# Patient Record
Sex: Female | Born: 1970 | Race: White | Hispanic: No | Smoking: Never smoker
Health system: Southern US, Community
[De-identification: ages and names within clinical notes are randomized; demographics above are authoritative.]

## PROBLEM LIST (undated history)

## (undated) DIAGNOSIS — M797 Fibromyalgia: Secondary | ICD-10-CM

## (undated) DIAGNOSIS — Z9889 Other specified postprocedural states: Secondary | ICD-10-CM

## (undated) DIAGNOSIS — F419 Anxiety disorder, unspecified: Secondary | ICD-10-CM

## (undated) DIAGNOSIS — D649 Anemia, unspecified: Secondary | ICD-10-CM

## (undated) DIAGNOSIS — F32A Depression, unspecified: Secondary | ICD-10-CM

## (undated) DIAGNOSIS — R112 Nausea with vomiting, unspecified: Secondary | ICD-10-CM

## (undated) DIAGNOSIS — F329 Major depressive disorder, single episode, unspecified: Secondary | ICD-10-CM

## (undated) DIAGNOSIS — T7840XA Allergy, unspecified, initial encounter: Secondary | ICD-10-CM

## (undated) DIAGNOSIS — M1990 Unspecified osteoarthritis, unspecified site: Secondary | ICD-10-CM

## (undated) DIAGNOSIS — Z87442 Personal history of urinary calculi: Secondary | ICD-10-CM

## (undated) DIAGNOSIS — K439 Ventral hernia without obstruction or gangrene: Secondary | ICD-10-CM

## (undated) DIAGNOSIS — K219 Gastro-esophageal reflux disease without esophagitis: Secondary | ICD-10-CM

## (undated) DIAGNOSIS — I10 Essential (primary) hypertension: Secondary | ICD-10-CM

## (undated) DIAGNOSIS — I8390 Asymptomatic varicose veins of unspecified lower extremity: Secondary | ICD-10-CM

## (undated) DIAGNOSIS — E039 Hypothyroidism, unspecified: Secondary | ICD-10-CM

## (undated) HISTORY — DX: Allergy, unspecified, initial encounter: T78.40XA

## (undated) HISTORY — PX: CHOLECYSTECTOMY: SHX55

## (undated) HISTORY — DX: Personal history of urinary calculi: Z87.442

## (undated) HISTORY — DX: Asymptomatic varicose veins of unspecified lower extremity: I83.90

## (undated) HISTORY — DX: Hypothyroidism, unspecified: E03.9

## (undated) HISTORY — PX: FOOT SURGERY: SHX648

## (undated) HISTORY — DX: Anxiety disorder, unspecified: F41.9

## (undated) HISTORY — PX: OTHER SURGICAL HISTORY: SHX169

## (undated) HISTORY — PX: CARPAL TUNNEL RELEASE: SHX101

## (undated) HISTORY — PX: LITHOTRIPSY: SUR834

---
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
## 1997-09-05 ENCOUNTER — Ambulatory Visit (HOSPITAL_BASED_OUTPATIENT_CLINIC_OR_DEPARTMENT_OTHER): Admission: RE | Admit: 1997-09-05 | Discharge: 1997-09-05 | Payer: Self-pay | Admitting: *Deleted

## 1997-10-08 ENCOUNTER — Encounter: Admission: RE | Admit: 1997-10-08 | Discharge: 1998-01-06 | Payer: Self-pay | Admitting: Internal Medicine

## 1998-01-06 ENCOUNTER — Encounter: Admission: RE | Admit: 1998-01-06 | Discharge: 1998-02-14 | Payer: Self-pay | Admitting: Internal Medicine

## 1998-04-07 ENCOUNTER — Encounter: Admission: RE | Admit: 1998-04-07 | Discharge: 1998-04-10 | Payer: Self-pay | Admitting: Orthopedic Surgery

## 1998-06-06 ENCOUNTER — Inpatient Hospital Stay (HOSPITAL_COMMUNITY): Admission: AD | Admit: 1998-06-06 | Discharge: 1998-06-06 | Payer: Self-pay | Admitting: Obstetrics and Gynecology

## 1998-10-23 ENCOUNTER — Inpatient Hospital Stay (HOSPITAL_COMMUNITY): Admission: AD | Admit: 1998-10-23 | Discharge: 1998-10-23 | Payer: Self-pay | Admitting: Obstetrics and Gynecology

## 1998-10-31 ENCOUNTER — Encounter: Admission: RE | Admit: 1998-10-31 | Discharge: 1999-01-29 | Payer: Self-pay | Admitting: Obstetrics and Gynecology

## 1998-11-09 ENCOUNTER — Inpatient Hospital Stay (HOSPITAL_COMMUNITY): Admission: AD | Admit: 1998-11-09 | Discharge: 1998-11-09 | Payer: Self-pay | Admitting: Obstetrics and Gynecology

## 1998-11-29 ENCOUNTER — Inpatient Hospital Stay (HOSPITAL_COMMUNITY): Admission: AD | Admit: 1998-11-29 | Discharge: 1998-11-29 | Payer: Self-pay | Admitting: Obstetrics & Gynecology

## 1998-12-10 ENCOUNTER — Inpatient Hospital Stay (HOSPITAL_COMMUNITY): Admission: AD | Admit: 1998-12-10 | Discharge: 1998-12-10 | Payer: Self-pay | Admitting: *Deleted

## 1998-12-11 ENCOUNTER — Observation Stay (HOSPITAL_COMMUNITY): Admission: AD | Admit: 1998-12-11 | Discharge: 1998-12-12 | Payer: Self-pay | Admitting: Obstetrics and Gynecology

## 1998-12-12 ENCOUNTER — Encounter: Payer: Self-pay | Admitting: Obstetrics and Gynecology

## 1998-12-31 ENCOUNTER — Inpatient Hospital Stay (HOSPITAL_COMMUNITY): Admission: AD | Admit: 1998-12-31 | Discharge: 1999-01-04 | Payer: Self-pay | Admitting: Obstetrics and Gynecology

## 1999-02-17 ENCOUNTER — Emergency Department (HOSPITAL_COMMUNITY): Admission: EM | Admit: 1999-02-17 | Discharge: 1999-02-17 | Payer: Self-pay | Admitting: Emergency Medicine

## 1999-03-03 ENCOUNTER — Ambulatory Visit (HOSPITAL_COMMUNITY): Admission: RE | Admit: 1999-03-03 | Discharge: 1999-03-04 | Payer: Self-pay | Admitting: Surgery

## 1999-03-03 ENCOUNTER — Encounter: Payer: Self-pay | Admitting: Surgery

## 1999-03-03 ENCOUNTER — Encounter (INDEPENDENT_AMBULATORY_CARE_PROVIDER_SITE_OTHER): Payer: Self-pay | Admitting: *Deleted

## 2001-07-14 ENCOUNTER — Emergency Department (HOSPITAL_COMMUNITY): Admission: EM | Admit: 2001-07-14 | Discharge: 2001-07-14 | Payer: Self-pay | Admitting: Emergency Medicine

## 2001-07-14 ENCOUNTER — Encounter: Payer: Self-pay | Admitting: Emergency Medicine

## 2002-09-09 ENCOUNTER — Emergency Department (HOSPITAL_COMMUNITY): Admission: EM | Admit: 2002-09-09 | Discharge: 2002-09-09 | Payer: Self-pay | Admitting: Emergency Medicine

## 2004-03-03 ENCOUNTER — Ambulatory Visit: Payer: Self-pay | Admitting: Internal Medicine

## 2004-03-30 ENCOUNTER — Ambulatory Visit: Payer: Self-pay | Admitting: Internal Medicine

## 2004-04-27 ENCOUNTER — Ambulatory Visit: Payer: Self-pay | Admitting: Internal Medicine

## 2004-08-11 ENCOUNTER — Ambulatory Visit: Payer: Self-pay | Admitting: Internal Medicine

## 2004-11-17 ENCOUNTER — Ambulatory Visit: Payer: Self-pay | Admitting: Internal Medicine

## 2004-11-25 ENCOUNTER — Ambulatory Visit: Payer: Self-pay | Admitting: Internal Medicine

## 2005-02-10 ENCOUNTER — Ambulatory Visit (HOSPITAL_BASED_OUTPATIENT_CLINIC_OR_DEPARTMENT_OTHER): Admission: RE | Admit: 2005-02-10 | Discharge: 2005-02-10 | Payer: Self-pay | Admitting: Orthopedic Surgery

## 2005-05-07 ENCOUNTER — Other Ambulatory Visit: Admission: RE | Admit: 2005-05-07 | Discharge: 2005-05-07 | Payer: Self-pay | Admitting: Obstetrics and Gynecology

## 2005-06-01 ENCOUNTER — Ambulatory Visit: Payer: Self-pay | Admitting: Internal Medicine

## 2005-07-09 ENCOUNTER — Ambulatory Visit: Payer: Self-pay | Admitting: Internal Medicine

## 2005-10-05 ENCOUNTER — Emergency Department (HOSPITAL_COMMUNITY): Admission: EM | Admit: 2005-10-05 | Discharge: 2005-10-05 | Payer: Self-pay | Admitting: Emergency Medicine

## 2005-12-03 ENCOUNTER — Ambulatory Visit: Payer: Self-pay | Admitting: Internal Medicine

## 2006-01-14 ENCOUNTER — Ambulatory Visit: Payer: Self-pay | Admitting: Internal Medicine

## 2006-03-03 ENCOUNTER — Emergency Department (HOSPITAL_COMMUNITY): Admission: EM | Admit: 2006-03-03 | Discharge: 2006-03-03 | Payer: Self-pay | Admitting: Emergency Medicine

## 2006-03-08 ENCOUNTER — Ambulatory Visit: Payer: Self-pay | Admitting: Internal Medicine

## 2006-06-13 ENCOUNTER — Ambulatory Visit: Payer: Self-pay | Admitting: Internal Medicine

## 2006-07-11 ENCOUNTER — Emergency Department (HOSPITAL_COMMUNITY): Admission: EM | Admit: 2006-07-11 | Discharge: 2006-07-12 | Payer: Self-pay | Admitting: Emergency Medicine

## 2006-07-30 ENCOUNTER — Emergency Department (HOSPITAL_COMMUNITY): Admission: EM | Admit: 2006-07-30 | Discharge: 2006-07-31 | Payer: Self-pay | Admitting: Emergency Medicine

## 2006-08-01 ENCOUNTER — Ambulatory Visit: Payer: Self-pay | Admitting: Internal Medicine

## 2006-08-05 DIAGNOSIS — J309 Allergic rhinitis, unspecified: Secondary | ICD-10-CM | POA: Insufficient documentation

## 2006-08-05 DIAGNOSIS — F411 Generalized anxiety disorder: Secondary | ICD-10-CM | POA: Insufficient documentation

## 2006-08-05 DIAGNOSIS — E039 Hypothyroidism, unspecified: Secondary | ICD-10-CM | POA: Insufficient documentation

## 2006-08-11 ENCOUNTER — Ambulatory Visit (HOSPITAL_COMMUNITY): Admission: RE | Admit: 2006-08-11 | Discharge: 2006-08-11 | Payer: Self-pay | Admitting: Urology

## 2006-09-29 ENCOUNTER — Encounter: Payer: Self-pay | Admitting: Internal Medicine

## 2006-11-01 ENCOUNTER — Telehealth: Payer: Self-pay | Admitting: Family Medicine

## 2006-12-08 ENCOUNTER — Telehealth: Payer: Self-pay | Admitting: Internal Medicine

## 2006-12-15 ENCOUNTER — Ambulatory Visit: Payer: Self-pay | Admitting: Internal Medicine

## 2007-02-22 ENCOUNTER — Telehealth: Payer: Self-pay | Admitting: Internal Medicine

## 2007-07-03 ENCOUNTER — Telehealth: Payer: Self-pay | Admitting: Internal Medicine

## 2007-09-06 ENCOUNTER — Telehealth: Payer: Self-pay | Admitting: Family Medicine

## 2007-09-08 ENCOUNTER — Emergency Department (HOSPITAL_COMMUNITY): Admission: EM | Admit: 2007-09-08 | Discharge: 2007-09-08 | Payer: Self-pay | Admitting: Emergency Medicine

## 2007-09-14 ENCOUNTER — Encounter: Payer: Self-pay | Admitting: Family Medicine

## 2007-09-14 ENCOUNTER — Telehealth: Payer: Self-pay | Admitting: *Deleted

## 2007-09-20 ENCOUNTER — Ambulatory Visit: Payer: Self-pay | Admitting: Internal Medicine

## 2007-09-20 DIAGNOSIS — E669 Obesity, unspecified: Secondary | ICD-10-CM

## 2007-09-20 DIAGNOSIS — F438 Other reactions to severe stress: Secondary | ICD-10-CM

## 2007-09-20 DIAGNOSIS — R0789 Other chest pain: Secondary | ICD-10-CM

## 2007-09-20 DIAGNOSIS — F4322 Adjustment disorder with anxiety: Secondary | ICD-10-CM

## 2007-09-24 DIAGNOSIS — Z87442 Personal history of urinary calculi: Secondary | ICD-10-CM

## 2007-09-25 ENCOUNTER — Ambulatory Visit: Payer: Self-pay | Admitting: Licensed Clinical Social Worker

## 2007-09-26 ENCOUNTER — Ambulatory Visit: Payer: Self-pay

## 2007-09-26 LAB — CONVERTED CEMR LAB
AST: 20 units/L (ref 0–37)
Albumin: 3.6 g/dL (ref 3.5–5.2)
Alkaline Phosphatase: 89 units/L (ref 39–117)
BUN: 14 mg/dL (ref 6–23)
Bilirubin, Direct: 0.1 mg/dL (ref 0.0–0.3)
Chloride: 111 meq/L (ref 96–112)
Cholesterol: 160 mg/dL (ref 0–200)
Glucose, Bld: 113 mg/dL — ABNORMAL HIGH (ref 70–99)
Potassium: 4.1 meq/L (ref 3.5–5.1)
Total Protein: 7.5 g/dL (ref 6.0–8.3)

## 2007-09-27 ENCOUNTER — Telehealth: Payer: Self-pay | Admitting: *Deleted

## 2007-09-29 ENCOUNTER — Ambulatory Visit: Payer: Self-pay

## 2007-09-29 ENCOUNTER — Encounter: Admission: RE | Admit: 2007-09-29 | Discharge: 2007-09-29 | Payer: Self-pay | Admitting: Internal Medicine

## 2007-10-06 ENCOUNTER — Ambulatory Visit: Payer: Self-pay | Admitting: Hematology

## 2007-10-09 ENCOUNTER — Telehealth: Payer: Self-pay | Admitting: *Deleted

## 2007-10-12 ENCOUNTER — Ambulatory Visit: Payer: Self-pay | Admitting: Internal Medicine

## 2007-10-12 DIAGNOSIS — R7309 Other abnormal glucose: Secondary | ICD-10-CM

## 2007-10-12 DIAGNOSIS — E785 Hyperlipidemia, unspecified: Secondary | ICD-10-CM

## 2007-10-12 DIAGNOSIS — E786 Lipoprotein deficiency: Secondary | ICD-10-CM | POA: Insufficient documentation

## 2007-11-15 ENCOUNTER — Telehealth: Payer: Self-pay | Admitting: Internal Medicine

## 2007-11-23 ENCOUNTER — Ambulatory Visit: Payer: Self-pay | Admitting: Internal Medicine

## 2007-11-23 DIAGNOSIS — I8390 Asymptomatic varicose veins of unspecified lower extremity: Secondary | ICD-10-CM

## 2007-12-15 ENCOUNTER — Ambulatory Visit: Payer: Self-pay | Admitting: Family Medicine

## 2007-12-15 DIAGNOSIS — N200 Calculus of kidney: Secondary | ICD-10-CM | POA: Insufficient documentation

## 2007-12-15 LAB — CONVERTED CEMR LAB
Protein, U semiquant: 30
Urobilinogen, UA: 1
WBC Urine, dipstick: NEGATIVE

## 2008-01-15 ENCOUNTER — Encounter: Payer: Self-pay | Admitting: Internal Medicine

## 2008-02-22 ENCOUNTER — Ambulatory Visit: Payer: Self-pay | Admitting: Internal Medicine

## 2008-02-27 LAB — CONVERTED CEMR LAB
CO2: 29 meq/L (ref 19–32)
Calcium: 9 mg/dL (ref 8.4–10.5)
Chloride: 106 meq/L (ref 96–112)
Glucose, Bld: 118 mg/dL — ABNORMAL HIGH (ref 70–99)
Hgb A1c MFr Bld: 5.7 % (ref 4.6–6.0)
Sodium: 141 meq/L (ref 135–145)
TSH: 2.14 microintl units/mL (ref 0.35–5.50)

## 2008-04-04 ENCOUNTER — Ambulatory Visit: Payer: Self-pay | Admitting: Internal Medicine

## 2008-04-22 ENCOUNTER — Telehealth: Payer: Self-pay | Admitting: *Deleted

## 2008-06-21 ENCOUNTER — Ambulatory Visit: Payer: Self-pay | Admitting: Internal Medicine

## 2008-06-21 DIAGNOSIS — H1045 Other chronic allergic conjunctivitis: Secondary | ICD-10-CM

## 2008-06-21 DIAGNOSIS — T50995A Adverse effect of other drugs, medicaments and biological substances, initial encounter: Secondary | ICD-10-CM | POA: Insufficient documentation

## 2008-06-25 ENCOUNTER — Telehealth: Payer: Self-pay | Admitting: Internal Medicine

## 2008-07-22 ENCOUNTER — Telehealth: Payer: Self-pay | Admitting: Internal Medicine

## 2008-07-23 ENCOUNTER — Telehealth: Payer: Self-pay | Admitting: Internal Medicine

## 2008-10-25 ENCOUNTER — Ambulatory Visit: Payer: Self-pay | Admitting: Family Medicine

## 2008-10-25 DIAGNOSIS — M25519 Pain in unspecified shoulder: Secondary | ICD-10-CM | POA: Insufficient documentation

## 2008-11-07 ENCOUNTER — Telehealth: Payer: Self-pay | Admitting: Internal Medicine

## 2008-11-08 ENCOUNTER — Ambulatory Visit: Payer: Self-pay | Admitting: Internal Medicine

## 2008-11-08 DIAGNOSIS — F4320 Adjustment disorder, unspecified: Secondary | ICD-10-CM | POA: Insufficient documentation

## 2008-11-14 ENCOUNTER — Encounter: Payer: Self-pay | Admitting: Internal Medicine

## 2008-11-24 ENCOUNTER — Telehealth: Payer: Self-pay | Admitting: Internal Medicine

## 2008-11-25 DIAGNOSIS — Z87442 Personal history of urinary calculi: Secondary | ICD-10-CM

## 2008-11-25 HISTORY — DX: Personal history of urinary calculi: Z87.442

## 2008-11-27 ENCOUNTER — Ambulatory Visit: Payer: Self-pay | Admitting: Internal Medicine

## 2008-11-27 DIAGNOSIS — Z9189 Other specified personal risk factors, not elsewhere classified: Secondary | ICD-10-CM | POA: Insufficient documentation

## 2008-11-27 DIAGNOSIS — R1031 Right lower quadrant pain: Secondary | ICD-10-CM

## 2008-11-27 DIAGNOSIS — R350 Frequency of micturition: Secondary | ICD-10-CM

## 2008-11-27 DIAGNOSIS — J069 Acute upper respiratory infection, unspecified: Secondary | ICD-10-CM | POA: Insufficient documentation

## 2008-11-27 LAB — CONVERTED CEMR LAB
Bilirubin Urine: NEGATIVE
Blood in Urine, dipstick: NEGATIVE
Ketones, urine, test strip: NEGATIVE
Nitrite: NEGATIVE
Specific Gravity, Urine: <1.005
pH: 6.5

## 2008-12-23 ENCOUNTER — Telehealth: Payer: Self-pay | Admitting: *Deleted

## 2009-01-21 ENCOUNTER — Ambulatory Visit: Payer: Self-pay | Admitting: Family Medicine

## 2009-01-21 DIAGNOSIS — L723 Sebaceous cyst: Secondary | ICD-10-CM

## 2009-04-27 ENCOUNTER — Emergency Department (HOSPITAL_COMMUNITY): Admission: EM | Admit: 2009-04-27 | Discharge: 2009-04-27 | Payer: Self-pay | Admitting: Emergency Medicine

## 2009-05-26 ENCOUNTER — Telehealth: Payer: Self-pay | Admitting: *Deleted

## 2009-06-06 ENCOUNTER — Ambulatory Visit: Payer: Self-pay | Admitting: Internal Medicine

## 2009-06-11 LAB — CONVERTED CEMR LAB
ALT: 21 units/L (ref 0–35)
AST: 20 units/L (ref 0–37)
Basophils Relative: 0.8 % (ref 0.0–3.0)
Chloride: 100 meq/L (ref 96–112)
Cholesterol: 209 mg/dL — ABNORMAL HIGH (ref 0–200)
Direct LDL: 143.2 mg/dL
Eosinophils Relative: 2.9 % (ref 0.0–5.0)
GFR calc non Af Amer: 80.04 mL/min (ref >60)
HCT: 42.8 % (ref 36.0–46.0)
Hemoglobin: 14.7 g/dL (ref 12.0–15.0)
Lymphs Abs: 2.4 10*3/uL (ref 0.7–4.0)
MCV: 90.7 fL (ref 78.0–100.0)
Monocytes Absolute: 0.5 10*3/uL (ref 0.1–1.0)
Monocytes Relative: 6.5 % (ref 3.0–12.0)
Potassium: 4.3 meq/L (ref 3.5–5.1)
RBC: 4.72 M/uL (ref 3.87–5.11)
Sodium: 141 meq/L (ref 135–145)
TSH: 4.28 microintl units/mL (ref 0.35–5.50)
Total Bilirubin: 0.7 mg/dL (ref 0.3–1.2)
Total CHOL/HDL Ratio: 5
Total Protein: 7.8 g/dL (ref 6.0–8.3)
VLDL: 49.4 mg/dL — ABNORMAL HIGH (ref 0.0–40.0)
WBC: 7.6 10*3/uL (ref 4.5–10.5)

## 2009-08-26 ENCOUNTER — Ambulatory Visit: Payer: Self-pay | Admitting: Internal Medicine

## 2009-08-26 DIAGNOSIS — G479 Sleep disorder, unspecified: Secondary | ICD-10-CM | POA: Insufficient documentation

## 2009-08-26 LAB — CONVERTED CEMR LAB
Cholesterol, target level: 200 mg/dL
LDL Goal: 160 mg/dL

## 2009-08-27 ENCOUNTER — Encounter: Payer: Self-pay | Admitting: *Deleted

## 2009-08-27 LAB — CONVERTED CEMR LAB: TSH: 1.37 microintl units/mL (ref 0.35–5.50)

## 2009-09-17 ENCOUNTER — Telehealth: Payer: Self-pay | Admitting: *Deleted

## 2009-10-16 ENCOUNTER — Telehealth: Payer: Self-pay | Admitting: *Deleted

## 2009-11-26 ENCOUNTER — Ambulatory Visit: Payer: Self-pay | Admitting: Internal Medicine

## 2010-01-14 ENCOUNTER — Telehealth: Payer: Self-pay | Admitting: Internal Medicine

## 2010-01-15 ENCOUNTER — Ambulatory Visit: Payer: Self-pay | Admitting: Internal Medicine

## 2010-01-15 DIAGNOSIS — L259 Unspecified contact dermatitis, unspecified cause: Secondary | ICD-10-CM | POA: Insufficient documentation

## 2010-02-26 NOTE — Assessment & Plan Note (Signed)
Summary: FLU-SHOT/RCD   Nurse Visit   Allergies: 1)  ! Zofran Odt (Ondansetron) 2)  ! Nubain (Nalbuphine Hcl)  Orders Added: 1)  Admin 1st Vaccine [90471] 2)  Flu Vaccine 65yrs + [78295]  Flu Vaccine Consent Questions     Do you have a history of severe allergic reactions to this vaccine? no    Any prior history of allergic reactions to egg and/or gelatin? no    Do you have a sensitivity to the preservative Thimersol? no    Do you have a past history of Guillan-Barre Syndrome? no    Do you currently have an acute febrile illness? no    Have you ever had a severe reaction to latex? no    Vaccine information given and explained to patient? yes    Are you currently pregnant? no    Lot Number:[id number]   Exp Date:07/25/2010   Site Given  Left Deltoid IM Romualdo Bolk, CMA (AAMA)  November 26, 2009 4:06 PM

## 2010-02-26 NOTE — Assessment & Plan Note (Signed)
Summary: follow up/ssc   Vital Signs:  Patient profile:   40 year old female Menstrual status:  regular LMP:     05/25/2009 Height:      69 inches Weight:      325 pounds BMI:     48.17 Pulse rate:   72 / minute BP sitting:   100 / 60  (right arm) Cuff size:   large  Vitals Entered By: Romualdo Bolk, CMA Duncan Dull) (Jun 06, 2009 10:11 AM) CC: follow-up visit LMP (date): 05/25/2009 LMP - Character: normal Menarche (age onset years): unsure   Menses interval (days): 28 Menstrual flow (days): 3 Enter LMP: 05/25/2009   History of Present Illness: [name]  comesin for follow up  .  Since last visit  here  there have been no major changes in health status  . doing ok.   Mood ZOloft  : doig well on current dose  and no panic attacks  sleep ok.  THyroid   :  doing ok no missed doses . no se of meds   Renal stones .Marland KitchenMarland KitchenUrology:  fluid pill and   alropurinol   note   checked per Dr Patsi Sears .  LIIDIds: no recent check  Heels and legs : doing  better   able too walk well.      Preventive Screening-Counseling & Management  Alcohol-Tobacco     Alcohol drinks/day: 0     Smoking Status: never  Caffeine-Diet-Exercise     Caffeine use/day: 4     Does Patient Exercise: yes     Type of exercise: walking     Exercise (avg: min/session): 30-60     Times/week: 5  Current Medications (verified): 1)  Zoloft 100 Mg  Tabs (Sertraline Hcl) .... Take 2  Mouth Once Daily or As Directed 2)  Levothyroxine Sodium 100 Mcg Tabs (Levothyroxine Sodium) .Marland Kitchen.. 1 By Mouth Once Daily 3)  Ativan 0.5 Mg  Tabs (Lorazepam) .Marland Kitchen.. 1 To 2 Every 6 Hours As Needed 4)  Urocit-K 10 10 Meq (1080 Mg) Cr-Tabs (Potassium Citrate) .... 2 By Mouth Two Times A Day 5)  Allopurinol 100 Mg Tabs (Allopurinol) .Marland Kitchen.. 1 By Mouth Two Times A Day 6)  Hydrochlorothiazide 25 Mg Tabs (Hydrochlorothiazide) .Marland Kitchen.. 1 By Mouth Once Daily 7)  Flonase 50 Mcg/act Susp (Fluticasone Propionate) .... 2 Sprays Each Nares  Each Day 8)   Meloxicam 15 Mg Tabs (Meloxicam) .Marland Kitchen.. 1 By Mouth Once Daily  Allergies (verified): 1)  ! Zofran Odt (Ondansetron) 2)  ! Nubain (Nalbuphine Hcl)  Past History:  Past medical, surgical, family and social histories (including risk factors) reviewed, and no changes noted (except as noted below).  Past Medical History: Hypothyroidism Anxiety Allergic rhinitis Nephrolithiasis, hx of last one November  2010  under urology care  Childbirth  Varicose veins      Past Surgical History: Reviewed history from 12/15/2007 and no changes required. Carpal tunnel release Rt Knee sx-08/1987 foot surgery  Cholecystectomy  lithotripsy twice, sees Dr. Patsi Sears  Past History:  Care Management: Urology: Dr. Marcello Fennel  Jan 2010 check Varicose veins  Ortho Lestine Box in the past for foot surgery.  Family History: Reviewed history from 04/04/2008 and no changes required. Family History Breast cancer 1st degree relative mom   died  October 16, 2006    son with adhd  on meds    Social History: Reviewed history from 04/04/2008 and no changes required. separated    ex  in  [place] Never Smoked Alcohol use-no Drug use-no child  in elem school     Review of Systems  The patient denies anorexia, fever, weight loss, weight gain, vision loss, decreased hearing, chest pain, syncope, dyspnea on exertion, peripheral edema, prolonged cough, abdominal pain, melena, transient blindness, difficulty walking, depression, abnormal bleeding, enlarged lymph nodes, and angioedema.    Physical Exam  General:  Well-developed,well-nourished,in no acute distress; alert,appropriate and cooperative throughout examination Head:  normocephalic and atraumatic.   Eyes:  vision grossly intact, pupils equal, and pupils round.   Ears:  R ear normal, L ear normal, and no external deformities.   Mouth:  pharynx pink and moist.   Neck:  No deformities, masses, or tenderness noted. Lungs:  Normal respiratory effort, chest expands  symmetrically. Lungs are clear to auscultation, no crackles or wheezes. Heart:  Normal rate and regular rhythm. S1 and S2 normal without gallop, murmur, click, rub or other extra sounds. Abdomen:  Bowel sounds positive,abdomen soft and non-tender without masses, organomegaly or   noted. Msk:  no joint swelling, no joint warmth, and no redness over joints.   Pulses:  nl cap refill  Extremities:  1+ left pedal edema and 1+ right pedal edema.   Neurologic:  non focal  Skin:  turgor normal, color normal, no petechiae, and no purpura.   Cervical Nodes:  No lymphadenopathy noted Psych:  Oriented X3, normally interactive, good eye contact, not anxious appearing, and not depressed appearing.     Impression & Recommendations:  Problem # 1:  HYPOTHYROIDISM (ICD-244.9) due for labs  Her updated medication list for this problem includes:    Levothyroxine Sodium 100 Mcg Tabs (Levothyroxine sodium) .Marland Kitchen... 1 by mouth once daily  Orders: TLB-TSH (Thyroid Stimulating Hormone) (84443-TSH) TLB-Lipid Panel (80061-LIPID) Venipuncture ([address])  Problem # 2:  ADJUSTMENT DISORDER WITH ANXIETY (ICD-309.24) Assessment: Improved continue meds for now  Problem # 3:  RENAL CALCULUS (ICD-592.0) under  uro care and stable  Orders: T-Uric Acid (Blood) (60454-09811)  Problem # 4:  HYPERGLYCEMIA (ICD-790.29)  Orders: TLB-BMP (Basic Metabolic Panel-BMET) (80048-METABOL) TLB-A1C / Hgb A1C (Glycohemoglobin) (83036-A1C) Venipuncture (91478)  Labs Reviewed: Creat: 1.0 (02/22/2008)     Problem # 5:  OBESITY, UNSPECIFIED (ICD-278.00) weight loss recommended  Problem # 6:  HYPERLIPIDEMIA (ICD-272.4) check today Orders: TLB-Hepatic/Liver Function Pnl (80076-HEPATIC) Venipuncture (29562)  Complete Medication List: 1)  Zoloft 100 Mg Tabs (Sertraline hcl) .... Take 2  mouth once daily or as directed 2)  Levothyroxine Sodium 100 Mcg Tabs (Levothyroxine sodium) .Marland Kitchen.. 1 by mouth once daily 3)  Ativan 0.5 Mg Tabs  (Lorazepam) .Marland Kitchen.. 1 to 2 every 6 hours as needed 4)  Urocit-k 10 10 Meq (1080 Mg) Cr-tabs (Potassium citrate) .... 2 by mouth two times a day 5)  Allopurinol 100 Mg Tabs (Allopurinol) .Marland Kitchen.. 1 by mouth two times a day 6)  Hydrochlorothiazide 25 Mg Tabs (Hydrochlorothiazide) .Marland Kitchen.. 1 by mouth once daily 7)  Flonase 50 Mcg/act Susp (Fluticasone propionate) .... 2 sprays each nares  each day 8)  Meloxicam 15 Mg Tabs (Meloxicam) .Marland Kitchen.. 1 by mouth once daily  Other Orders: TLB-CBC Platelet - w/Differential (85025-CBCD)  Patient Instructions: 1)  You will be informed of lab results when available.  2)  contiue same meds  3)  have urology send [place] copies of  evaluations. 4)  follow up depending on lab results   5)  79-months.

## 2010-02-26 NOTE — Assessment & Plan Note (Signed)
Summary: worsening hives and pustules(rash)/dm   Vital Signs:  Patient profile:   40 year old female Menstrual status:  regular LMP:     12/25/2009 Weight:      318 pounds Temp:     98.4 degrees F oral Pulse rate:   72 / minute BP sitting:   100 / 70  (right arm) Cuff size:   large  Vitals Entered By: Romualdo Bolk, CMA Duncan Dull) (January 15, 2010 9:53 AM) CC: Hives all over body that started 12/17, no change in bath wash or detergent LMP (date): 12/25/2009 LMP - Character: normal Menarche (age onset years): unsure   Menses interval (days): 28 Menstrual flow (days): 3 Enter LMP: 12/25/2009   History of Present Illness: [name] comes in today  for  acute onset of iotchy rash that started on her arems and now on face and legs..  NO exposures noted and no fever  swelling or new meds.   Tried otc without much help.  using antihistamines  .  NO hx of same   Had uri a week or so ago  No CP SOB COUGH WHEEZE .   No sig sunexposure  Mood : stable  on zoloft  Thyroid Taking meds  reg . l  Preventive Screening-Counseling & Management  Alcohol-Tobacco     Alcohol drinks/day: 0     Smoking Status: never  Caffeine-Diet-Exercise     Caffeine use/day: 4     Does Patient Exercise: yes     Type of exercise: walking     Exercise (avg: min/session): 30-60     Times/week: 5  Current Medications (verified): 1)  Zoloft 100 Mg  Tabs (Sertraline Hcl) .... Take 2  Mouth Once Daily or As Directed 2)  Ativan 0.5 Mg  Tabs (Lorazepam) .Marland Kitchen.. 1 To 2 Every 6 Hours As Needed 3)  Urocit-K 10 10 Meq (1080 Mg) Cr-Tabs (Potassium Citrate) .... 2 By Mouth Two Times A Day 4)  Allopurinol 100 Mg Tabs (Allopurinol) .Marland Kitchen.. 1 By Mouth Two Times A Day 5)  Hydrochlorothiazide 25 Mg Tabs (Hydrochlorothiazide) .Marland Kitchen.. 1 By Mouth Once Daily 6)  Flonase 50 Mcg/act Susp (Fluticasone Propionate) .... 2 Sprays Each Nares  Each Day 7)  Synthroid 112 Mcg Tabs (Levothyroxine Sodium) .Marland Kitchen.. 1 By Mouth Once Daily 8)   Lunesta 3 Mg Tabs (Eszopiclone) .Marland Kitchen.. 1 By Mouth  At Night  If Needed For Sleep  Allergies (verified): 1)  ! Zofran Odt (Ondansetron) 2)  ! Nubain (Nalbuphine Hcl)  Past History:  Past medical, surgical, family and social histories (including risk factors) reviewed, and no changes noted (except as noted below).  Past Medical History: Hypothyroidism Anxiety Allergic rhinitis Nephrolithiasis, hx of last one November  2010  under urology care  uric acid  rx Childbirth  Varicose veins      Past Surgical History: Reviewed history from 08/26/2009 and no changes required. Carpal tunnel release Rt Knee sx-08/1987 foot surgery  Cholecystectomy  lithotripsy twice, sees Dr. Patsi Sears Teeth Extracted on top for decay   Past History:  Care Management: Urology: Dr. Marcello Fennel  Jan 2010 check Varicose veins  Ortho Lestine Box in the past for foot surgery. Dentist: Tish Men  Family History: Reviewed history from 08/26/2009 and no changes required. Family History Breast cancer 1st degree relative mom   died  2006-10-25    son with adhd  on meds   Parent had dental infections .  Social History: Reviewed history from 08/26/2009 and no changes required. separated  ex  in  [place] visits son Never Smoked Alcohol use-no Drug use-no child in elem school     Review of Systems  The patient denies anorexia, fever, weight loss, weight gain, vision loss, decreased hearing, chest pain, prolonged cough, hemoptysis, abdominal pain, hematuria, abnormal bleeding, enlarged lymph nodes, and angioedema.         some ha before onset  had a cold like signs last week   Physical Exam  General:  Well-developed,well-nourished,in no acute distress; alert,appropriate and cooperative throughout examinationwith obvious redness and rash  no sig edema  scratching Head:  Normocephalic and atraumatic without obvious abnormalities. No apparent alopecia or balding. Eyes:  clear Ears:  R ear normal and L ear  normal.  rash behin hear  Nose:  no external deformity, no external erythema, and no nasal discharge.   Mouth:  good dentition and pharynx pink and moist.   Neck:  No deformities, masses, or tenderness noted. Lungs:  Normal respiratory effort, chest expands symmetrically. Lungs are clear to auscultation, no crackles or wheezes.no dullness.   Heart:  Normal rate and regular rhythm. S1 and S2 normal without gallop, murmur, click, rub or other extra sounds. Msk:  no joint warmth and no redness over joints.   Pulses:  nl cap refill  Extremities:  no clubbing cyanosis or edema  Skin:  turgor normal.  facial erythema   forearms and some legs with  linear red rash non blandhing  some exoriated  no vesicle  none on palms   no rash on covered trunk  or upper arms Cervical Nodes:  No lymphadenopathy noted Psych:  Oriented X3, normally interactive, good eye contact, not anxious appearing, and not depressed appearing.     Impression & Recommendations:  Problem # 1:  DERMATITIS, ALLERGIC (ICD-692.9) ? contactant...    less likely drug rash  although is on allopurinal . disc with patient.  empiric rx  and follow up iof persistent or  progressive   Her updated medication list for this problem includes:    Prednisone 20 Mg Tabs (Prednisone) .Marland Kitchen... Take 3 a day x 3 days then 2 a day x3 days then 1/day x 3 days, then 1/2 a day x 3 days or as directed.    Fluocinolone Acetonide 0.01 % Crea (Fluocinolone acetonide) .Marland Kitchen... Apply to  allergic rash two times a day  not on face  Problem # 2:  NEPHROLITHIASIS, HX OF (ICD-V13.01) sees urology and  rx for high uric acid   on meds  allopurinol   Problem # 3:  ADJUSTMENT DISORDER WITH ANXIETY (ICD-309.24) Assessment: Improved stable   Complete Medication List: 1)  Zoloft 100 Mg Tabs (Sertraline hcl) .... Take 2  mouth once daily or as directed 2)  Ativan 0.5 Mg Tabs (Lorazepam) .Marland Kitchen.. 1 to 2 every 6 hours as needed 3)  Urocit-k 10 10 Meq (1080 Mg) Cr-tabs (Potassium  citrate) .... 2 by mouth two times a day 4)  Allopurinol 100 Mg Tabs (Allopurinol) .Marland Kitchen.. 1 by mouth two times a day 5)  Hydrochlorothiazide 25 Mg Tabs (Hydrochlorothiazide) .Marland Kitchen.. 1 by mouth once daily 6)  Flonase 50 Mcg/act Susp (Fluticasone propionate) .... 2 sprays each nares  each day 7)  Synthroid 112 Mcg Tabs (Levothyroxine sodium) .Marland Kitchen.. 1 by mouth once daily 8)  Lunesta 3 Mg Tabs (Eszopiclone) .Marland Kitchen.. 1 by mouth  at night  if needed for sleep 9)  Prednisone 20 Mg Tabs (Prednisone) .... Take 3 a day x 3 days then 2 a  day x3 days then 1/day x 3 days, then 1/2 a day x 3 days or as directed. 10)  Fluocinolone Acetonide 0.01 % Crea (Fluocinolone acetonide) .... Apply to  allergic rash two times a day  not on face  Patient Instructions: 1)  this acts like an allergic rash  from contactant.    Probably not medication allergy but conisder it as a cause  if persistent or  progressive   2)  Prednisone and cool compressed  3)  Can continue antihistamines and use Rx cream (NOT ON FACE) 4)  expect improvement in the next days .  can taper the med more quickly if doing well. 5)  Ask Urology  to send [place] a copy of you visits and labs done. Prescriptions: FLUOCINOLONE ACETONIDE 0.01 % CREA (FLUOCINOLONE ACETONIDE) apply to  allergic rash two times a day  not on face  #30 gram x 1   Entered and Authorized by:   Madelin Headings MD   Signed by:   Madelin Headings MD on 01/15/2010   Method used:   Electronically to        [facility]* (retail)       [address] [place], [place]  [address]       Ph: [phone]       Fax: [phone]   RxID:   [id number] PREDNISONE 20 MG TABS (PREDNISONE) Take 3 a day x 3 days then 2 a day x3 days then 1/day x 3 days, then 1/2 a day x 3 days or as directed.  #30 x 0   Entered and Authorized by:   Madelin Headings MD   Signed by:   Madelin Headings MD on 01/15/2010   Method used:   Electronically to        [facility]* (retail)       [address] [place], [place]  [address]       Ph: [phone]       Fax: [phone]   RxID:   [id number]    Orders Added: 1)  Est. Patient Level IV [42706]

## 2010-02-26 NOTE — Progress Notes (Signed)
Summary: refill zoloft  Phone Note From Pharmacy   [facility]* Reason for Call: Needs renewal Details for Reason: zoloft 100mg  Initial call taken by: Romualdo Bolk, CMA Duncan Dull),  September 17, 2009 2:24 PM  Follow-up for Phone Call        Rx sent to pharmacy Follow-up by: Romualdo Bolk, CMA Duncan Dull),  September 17, 2009 2:25 PM    Prescriptions: ZOLOFT 100 MG  TABS (SERTRALINE HCL) take 2  mouth once daily or as directed  #60 x 5   Entered by:   Romualdo Bolk, CMA (AAMA)   Authorized by:   Madelin Headings MD   Signed by:   Romualdo Bolk, CMA (AAMA) on 09/17/2009   Method used:   Electronically to        [facility]* (retail)       [address] [place], [place]  [address]       Ph: [phone]       Fax: [phone]   RxID:   [id number]

## 2010-02-26 NOTE — Progress Notes (Signed)
Summary: call?  Phone Note Call from Patient Call back at [phone]   Caller: Patient---live call Summary of Call: did u call this pt? some one called her this morning and did not leave a message. Initial call taken by: Warnell Forester,  May 26, 2009 11:14 AM  Follow-up for Phone Call        Pt made an appt for may 13 Follow-up by: Romualdo Bolk, CMA Duncan Dull),  May 26, 2009 2:41 PM

## 2010-02-26 NOTE — Progress Notes (Signed)
Summary: refills  Phone Note From Pharmacy   [facility]* Reason for Call: Needs renewal Details for Reason: levothryroxine 0.112mg  Initial call taken by: Romualdo Bolk, CMA (AAMA),  October 16, 2009 10:33 AM    Prescriptions: SYNTHROID 112 MCG TABS (LEVOTHYROXINE SODIUM) 1 by mouth once daily  #30 x 5   Entered by:   Romualdo Bolk, CMA (AAMA)   Authorized by:   Madelin Headings MD   Signed by:   Romualdo Bolk, CMA (AAMA) on 10/16/2009   Method used:   Electronically to        [facility]* (retail)       [address] [place], [place]  [address]       Ph: [phone]       Fax: [phone]   RxID:   [id number]

## 2010-02-26 NOTE — Letter (Signed)
Summary: Generic Letter  [place] [facility]  [address] [place], [place] [address]   Phone: [phone]  Fax: [phone]    08/27/2009  MARYPAT KIMMET [address] [place], [place]  [address]  Dear Ms. Hitchner,  Your TSH is better and no change in your thyroid dose. Please schedule a follow up in 6 months or soonier if needed.         Sincerely,   Tor Netters, CMA (AAMA)

## 2010-02-26 NOTE — Progress Notes (Signed)
Summary: REQ FOR APPt-triage please  Phone Note Call from Patient   Caller: Patient   [phone] Summary of Call: Would like to come in and see Dr Fabian Sharp ref rash, itching.... Pt denies any change in detergent, soap, etc.... adv nothing different.... Req appt tomorrow, 12/22 (sda slots only thing available), only wants to see Dr Fabian Sharp.... Pt can be reached at [phone].  Initial call taken by: Debbra Riding,  January 14, 2010 3:53 PM  Follow-up for Phone Call        [facility] Follow-up by: [facility] CMA AAMA,  January 14, 2010 4:05 PM  Additional Follow-up for Phone Call Additional follow up Details #1::        Pt complains of red splotches all body with pustules, and macules???  Hives?  Intense itching. Getting worse everyday.  Appt scheduled. Additional Follow-up by: Lynann Beaver CMA AAMA,  January 15, 2010 8:33 AM

## 2010-02-26 NOTE — Assessment & Plan Note (Signed)
Summary: follow up on meds and discuss sleep issues/ssc   Vital Signs:  Patient profile:   40 year old female Menstrual status:  regular LMP:     07/28/2009 Weight:      320 pounds Pulse rate:   72 / minute BP sitting:   120 / 70  (right arm) Cuff size:   large  Vitals Entered By: Romualdo Bolk, CMA (AAMA) (August 26, 2009 1:58 PM) CC: Follow-up visit on meds and discuss sleeping problems, Lipid Management LMP (date): 07/28/2009 LMP - Character: normal Menarche (age onset years): unsure   Menses interval (days): 28 Menstrual flow (days): 3 Enter LMP: 07/28/2009   History of Present Illness: [name] comes in today  for follow up of multiple medical problems .  since last visit  she has had dental problem: Dental teeth breaking and   remote trauma.     considering take them out.    2-4 on top.   then upper plate.  THyroid :only missed 2 day of meds.    Sleep  : recently hard to fall and maintain sleep at times  . No depressed some anxiety but   no change. No alcohol. drinsks daily sweet tea.   and lemonade and water.   Mood : ok  sometimes  to ocassional use of  ativan   .   Lipid Management History:      Negative NCEP/ATP III risk factors include female age less than 84 years old and non-tobacco-user status.    Preventive Screening-Counseling & Management  Alcohol-Tobacco     Alcohol drinks/day: 0     Smoking Status: never  Caffeine-Diet-Exercise     Caffeine use/day: 4     Does Patient Exercise: yes     Type of exercise: walking     Exercise (avg: min/session): 30-60     Times/week: 5  Current Medications (verified): 1)  Zoloft 100 Mg  Tabs (Sertraline Hcl) .... Take 2  Mouth Once Daily or As Directed 2)  Ativan 0.5 Mg  Tabs (Lorazepam) .Marland Kitchen.. 1 To 2 Every 6 Hours As Needed 3)  Urocit-K 10 10 Meq (1080 Mg) Cr-Tabs (Potassium Citrate) .... 2 By Mouth Two Times A Day 4)  Allopurinol 100 Mg Tabs (Allopurinol) .Marland Kitchen.. 1 By Mouth Two Times A Day 5)   Hydrochlorothiazide 25 Mg Tabs (Hydrochlorothiazide) .Marland Kitchen.. 1 By Mouth Once Daily 6)  Flonase 50 Mcg/act Susp (Fluticasone Propionate) .... 2 Sprays Each Nares  Each Day 7)  Synthroid 112 Mcg Tabs (Levothyroxine Sodium) .Marland Kitchen.. 1 By Mouth Once Daily  Allergies (verified): 1)  ! Zofran Odt (Ondansetron) 2)  ! Nubain (Nalbuphine Hcl)  Past History:  Past medical, surgical, family and social histories (including risk factors) reviewed, and no changes noted (except as noted below).  Past Medical History: Reviewed history from 06/06/2009 and no changes required. Hypothyroidism Anxiety Allergic rhinitis Nephrolithiasis, hx of last one November  2010  under urology care  Childbirth  Varicose veins      Past Surgical History: Carpal tunnel release Rt Knee sx-08/1987 foot surgery  Cholecystectomy  lithotripsy twice, sees Dr. Patsi Sears Teeth Extracted on top for decay   Past History:  Care Management: Urology: Dr. Marcello Fennel  Jan 2010 check Varicose veins  Ortho Lestine Box in the past for foot surgery. Dentist: Tish Men  Family History: Reviewed history from 04/04/2008 and no changes required. Family History Breast cancer 1st degree relative mom   died  2006-10-16    son with adhd  on  meds   Parent had dental infections .  Social History: Reviewed history from 06/06/2009 and no changes required. separated    ex  in  [place] visits son Never Smoked Alcohol use-no Drug use-no child in elem school     Review of Systems  The patient denies anorexia, fever, weight loss, vision loss, decreased hearing, chest pain, abdominal pain, difficulty walking, abnormal bleeding, enlarged lymph nodes, and angioedema.    Physical Exam  General:  Well-developed,well-nourished,in no acute distress; alert,appropriate and cooperative throughout examination Head:  normocephalic, atraumatic, and no abnormalities observed.   Eyes:  vision grossly intact, pupils equal, and pupils round.   Neck:  No  deformities, masses, or tenderness noted. Lungs:  Normal respiratory effort, chest expands symmetrically. Lungs are clear to auscultation, no crackles or wheezes. Heart:  Normal rate and regular rhythm. S1 and S2 normal without gallop, murmur, click, rub or other extra sounds. Abdomen:  Bowel sounds positive,abdomen soft and non-tender without masses, organomegaly or hernias noted. Extremities:  1+ left pedal edema and 1+ right pedal edema.   no lesions pulsed intact  Neurologic:  alert & oriented X3 and gait normal.  alert & oriented X3 and gait normal.   Skin:  turgor normal, color normal, no ecchymoses, and no petechiae.  turgor normal, color normal, no ecchymoses, and no petechiae.   Cervical Nodes:  No lymphadenopathy noted Psych:  Oriented X3, normally interactive, and good eye contact.  minimally anxious today Oriented X3, normally interactive, and good eye contact.     Impression & Recommendations:  Problem # 1:  HYPOTHYROIDISM (ICD-244.9) tsh could be better  check reading today  The following medications were removed from the medication list:    Levothyroxine Sodium 100 Mcg Tabs (Levothyroxine sodium) .Marland Kitchen... 1 by mouth once daily Her updated medication list for this problem includes:    Synthroid 112 Mcg Tabs (Levothyroxine sodium) .Marland Kitchen... 1 by mouth once daily  Labs Reviewed: TSH: 4.28 (06/06/2009)    HgBA1c: 5.6 (06/06/2009) Chol: 209 (06/06/2009)   HDL: 42.10 (06/06/2009)   LDL: 106 (09/20/2007)   TG: 247.0 (06/06/2009)  Orders: TLB-TSH (Thyroid Stimulating Hormone) (84443-TSH) Specimen Handling (16109)  Problem # 2:  UNSPECIFIED SLEEP DISTURBANCE (ICD-780.50) Assessment: New poss reactive     . disc options  lifestyle intervention   but can try short term lunesta as needed  ho x 2  sleep diary given.  Problem # 3:  ANXIETY, SITUATIONAL (ICD-308.3) Assessment: Improved ok to refill the ativan to use as needed last filled    10 months ago.   Problem # 4:  HYPERLIPIDEMIA  (ICD-272.4) decrease sugars  incrase exercise and lose weight  Problem # 5:  HYPERGLYCEMIA (ICD-790.29) lifestyle intervention  ocass monitoring.   Problem # 6:  OBESITY, UNSPECIFIED (ICD-278.00)  Ht: 69 (06/06/2009)   Wt: 320 (08/26/2009)   BMI: 48.17 (06/06/2009)  Complete Medication List: 1)  Zoloft 100 Mg Tabs (Sertraline hcl) .... Take 2  mouth once daily or as directed 2)  Ativan 0.5 Mg Tabs (Lorazepam) .Marland Kitchen.. 1 to 2 every 6 hours as needed 3)  Urocit-k 10 10 Meq (1080 Mg) Cr-tabs (Potassium citrate) .... 2 by mouth two times a day 4)  Allopurinol 100 Mg Tabs (Allopurinol) .Marland Kitchen.. 1 by mouth two times a day 5)  Hydrochlorothiazide 25 Mg Tabs (Hydrochlorothiazide) .Marland Kitchen.. 1 by mouth once daily 6)  Flonase 50 Mcg/act Susp (Fluticasone propionate) .... 2 sprays each nares  each day 7)  Synthroid 112 Mcg Tabs (Levothyroxine sodium) .Marland KitchenMarland KitchenMarland Kitchen  1 by mouth once daily 8)  Lunesta 3 Mg Tabs (Eszopiclone) .Marland Kitchen.. 1 by mouth  at night  if needed for sleep  Lipid Assessment/Plan:      Based on NCEP/ATP III, the patient's risk factor category is "0-1 risk factors".  The patient's lipid goals are as follows: Total cholesterol goal is 200; LDL cholesterol goal is 160; HDL cholesterol goal is 40; Triglyceride goal is 150.     Patient Instructions: 1)  ok to try lunesta  as needed a couple nights in a row .  2)  You will be informed of lab results when available.  then decide about  thyroid dosing.    3)  Limit caffiene after 2-3 in the afternoon. 4)  Limit or stop sweet drinks to help lose weight and prevent diabetes .  at least limit to one serving per day.  Prescriptions: ATIVAN 0.5 MG  TABS (LORAZEPAM) 1 to 2 every 6 hours as needed  #60 x 0   Entered and Authorized by:   Madelin Headings MD   Signed by:   Madelin Headings MD on 08/26/2009   Method used:   Print then Give to Patient   RxID:   [id number]  greater than 50% of visit spent in counseling    25 minutes

## 2010-04-15 LAB — CBC
HCT: 44.7 % (ref 36.0–46.0)
Hemoglobin: 15.2 g/dL — ABNORMAL HIGH (ref 12.0–15.0)
MCHC: 34 g/dL (ref 30.0–36.0)
MCV: 90.2 fL (ref 78.0–100.0)
Platelets: 212 10*3/uL (ref 150–400)
RDW: 13.9 % (ref 11.5–15.5)

## 2010-04-15 LAB — DIFFERENTIAL
Basophils Absolute: 0 10*3/uL (ref 0.0–0.1)
Basophils Relative: 0 % (ref 0–1)
Eosinophils Absolute: 0.1 10*3/uL (ref 0.0–0.7)
Eosinophils Relative: 1 % (ref 0–5)
Monocytes Absolute: 0.3 10*3/uL (ref 0.1–1.0)

## 2010-04-15 LAB — BASIC METABOLIC PANEL
BUN: 13 mg/dL (ref 6–23)
CO2: 26 mEq/L (ref 19–32)
Glucose, Bld: 133 mg/dL — ABNORMAL HIGH (ref 70–99)
Potassium: 4.4 mEq/L (ref 3.5–5.1)
Sodium: 138 mEq/L (ref 135–145)

## 2010-04-15 LAB — URINALYSIS, ROUTINE W REFLEX MICROSCOPIC
Bilirubin Urine: NEGATIVE
Hgb urine dipstick: NEGATIVE
Ketones, ur: NEGATIVE mg/dL
Specific Gravity, Urine: 1.025 (ref 1.005–1.030)
pH: 6 (ref 5.0–8.0)

## 2010-04-15 LAB — PREGNANCY, URINE: Preg Test, Ur: NEGATIVE

## 2010-05-04 ENCOUNTER — Telehealth: Payer: Self-pay | Admitting: *Deleted

## 2010-05-04 MED ORDER — SERTRALINE HCL 100 MG PO TABS: ORAL_TABLET | ORAL | Status: DC

## 2010-05-04 NOTE — Telephone Encounter (Signed)
Refill on zoloft 100mg . Pt needs a follow up appt. Letter mailed to pt.

## 2010-05-13 ENCOUNTER — Encounter: Payer: Self-pay | Admitting: *Deleted

## 2010-05-14 ENCOUNTER — Encounter: Payer: Self-pay | Admitting: Internal Medicine

## 2010-05-14 ENCOUNTER — Ambulatory Visit (INDEPENDENT_AMBULATORY_CARE_PROVIDER_SITE_OTHER): Payer: BC Managed Care – PPO | Admitting: Internal Medicine

## 2010-05-14 VITALS — BP 120/80 | HR 66 | Wt 317.0 lb

## 2010-05-14 DIAGNOSIS — Z87442 Personal history of urinary calculi: Secondary | ICD-10-CM

## 2010-05-14 DIAGNOSIS — G479 Sleep disorder, unspecified: Secondary | ICD-10-CM

## 2010-05-14 DIAGNOSIS — E039 Hypothyroidism, unspecified: Secondary | ICD-10-CM

## 2010-05-14 DIAGNOSIS — F4322 Adjustment disorder with anxiety: Secondary | ICD-10-CM

## 2010-05-14 DIAGNOSIS — R7309 Other abnormal glucose: Secondary | ICD-10-CM

## 2010-05-14 MED ORDER — LORAZEPAM 0.5 MG PO TABS: 0.5000 mg | ORAL_TABLET | Freq: Three times a day (TID) | ORAL | Status: DC

## 2010-05-14 NOTE — Assessment & Plan Note (Signed)
No recent recurrence is overdue for her urology check..   Is not taking prevention medicine.
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
## 2010-05-25 ENCOUNTER — Other Ambulatory Visit (INDEPENDENT_AMBULATORY_CARE_PROVIDER_SITE_OTHER): Payer: BC Managed Care – PPO | Admitting: Internal Medicine

## 2010-05-25 DIAGNOSIS — Z0000 Encounter for general adult medical examination without abnormal findings: Secondary | ICD-10-CM

## 2010-05-25 LAB — HEPATIC FUNCTION PANEL
Alkaline Phosphatase: 73 U/L (ref 39–117)
Bilirubin, Direct: 0.1 mg/dL (ref 0.0–0.3)
Total Protein: 6.9 g/dL (ref 6.0–8.3)

## 2010-05-25 LAB — CBC WITH DIFFERENTIAL/PLATELET
Basophils Absolute: 0 10*3/uL (ref 0.0–0.1)
Basophils Relative: 0.6 % (ref 0.0–3.0)
Eosinophils Absolute: 0.2 10*3/uL (ref 0.0–0.7)
Lymphocytes Relative: 38.6 % (ref 12.0–46.0)
MCHC: 34.1 g/dL (ref 30.0–36.0)
Monocytes Relative: 6.2 % (ref 3.0–12.0)
Neutrophils Relative %: 51.5 % (ref 43.0–77.0)
RBC: 4.56 Mil/uL (ref 3.87–5.11)

## 2010-05-25 LAB — BASIC METABOLIC PANEL
CO2: 27 mEq/L (ref 19–32)
Calcium: 8.9 mg/dL (ref 8.4–10.5)
Creatinine, Ser: 1.1 mg/dL (ref 0.4–1.2)
GFR: 59.6 mL/min — ABNORMAL LOW (ref >60.00)

## 2010-05-25 LAB — LIPID PANEL
HDL: 37.2 mg/dL — ABNORMAL LOW (ref >39.00)
LDL Cholesterol: 130 mg/dL — ABNORMAL HIGH (ref 0–99)
Total CHOL/HDL Ratio: 5
Triglycerides: 65 mg/dL (ref 0.0–149.0)

## 2010-05-25 LAB — POCT URINALYSIS DIPSTICK
Nitrite, UA: NEGATIVE
Urobilinogen, UA: 0.2
pH, UA: 5.5

## 2010-06-03 ENCOUNTER — Encounter: Payer: Self-pay | Admitting: *Deleted

## 2010-06-09 NOTE — H&P (Signed)
NAMEMERRILLYN, [name] NO.:  [id number]   MEDICAL RECORD NO.:  [id number]          PATIENT TYPE:  EMS   LOCATION:  ED                           FACILITY[facility]  [facility]   PHYSICIAN:  Michiel Cowboy, MDDATE OF BIRTH:  1970-08-07   DATE OF ADMISSION:  09/08/2007  DATE OF DISCHARGE:                              HISTORY & PHYSICAL   PRIMARY CARE Violetta Lavalle:  Dr. Fabian Sharp.   The patient is a 40 year old female.   CHIEF COMPLAINT:  Chest pain.   The patient is a 40 year old female with a history of obesity,  depression, anxiety, hypothyroidism and kidney stones, who was having a  very emotional evening today.  Her husband left her two days ago.  She  presented to the emergency department because she woke up today with  chest pressure radiating to her left arm.  In the emergency department,  she was tearful, attempting to locate her husband, tried to obtain help  from emergency room staff and security officers around her in an attempt  to locate her husband.  The patient stating that as soon as she gets  upset, she is staring to have chest pain, but it did get transiently  relieved with nitroglycerin.  It did not get relieved with morphine,  aspirin or GI cocktail.  The patient never had chest pain before.  She  does not smoke, does not drink.   FAMILY HISTORY:  Noncontributory.  Her mother did die two months ago.   ALLERGIES:  1. NUBAIN.  2. ZOFRAN.   MEDICATIONS:  1. Synthroid 100 mcg p.o. daily.  2. Zoloft 100 mg p.o. daily.   PHYSICAL EXAMINATION:  VITAL SIGNS:  Temperature 99, blood pressure  131/67, pulse 63, respirations 17, satting 100% on room air.  GENERAL:  The patient appears to be still preoccupied in her social  situation, trying to stay on the phone with her relatives.  HEENT:  Head nontraumatic.  Moist mucous membranes.  CHEST:  Chest pain is not reproducible by exam.  LUNGS:  Clear to auscultation bilaterally, but distant.  HEART:  Regular rate  and rhythm.  No murmurs, rubs or gallops.  ABDOMEN:  Soft, nontender, nondistended, but obese.  LOWER EXTREMITIES:  Obese, but no edema noted.  NEUROLOGIC:  Intact.   LABORATORY DATA:  White blood cell count 6.8, hemoglobin 13.9, sodium  139, potassium 3.5, creatinine 1.2.  Chest x-ray within normal limits.  EKG; there is a T-wave inversion in lead V1.  There is a small R-wave.  No old EKG available.  Cardiac enzymes are negative.   ASSESSMENT/PLAN:  1. This is a 40 year old female with risk factors consisting of      obesity, presenting with some atypical chest pain.  Given risk      factors, will observe overnight on telemetry.  Cycle cardiac      enzymes.  Further risks verified with fasting lipid panel,      hemoglobin A1c, TSH.  Will give Protonix and will give Ativan p.o.      as the patient is having a lot of anxiety currently.  Probably the  patient would benefit from an outpatient stress test.  2. Depression.  Continue Zoloft.  3. Prophylaxis - Lovenox plus Protonix.   Dr. Fabian Sharp will resume care in the morning.      Michiel Cowboy, MD  Electronically Signed     AVD/MEDQ  D:  09/08/2007  T:  09/08/2007  Job:  [id number]   cc:   Neta Mends. Fabian Sharp, MD  [address] [place], [place] [address]

## 2010-06-09 NOTE — Assessment & Plan Note (Signed)
[facility]                                 ON-CALL NOTE   NAME:SKINNERLynisha, Osuch                      MRN:          [id number]  DATE:07/30/2006                            DOB:          [birth year]    PRIMARY CARE PHYSICIAN:  Berniece Andreas, M.D.   ON CALL PROGRESS NOTE:  The husband is calling because the patient is in  the shower and has a kidney stone. He said that she would not go to the  emergency room and wants some medication called in.   I explained to the patient that we are not able to call in medication.  If she feels like it is a kidney stone, will have to go to the hospital.  He then began screaming on the phone to his wife in the shower. His wife  said that she did not want to go to the emergency room because she did  not want to wait. The husband says he is going to call 911 so she gets  immediate service. I said that is fine. Go ahead and all 911 and go to  the emergency room.     Jeffrey A. Tawanna Cooler, MD  Electronically Signed    JAT/MedQ  DD: 07/30/2006  DT: 07/31/2006  Job #: [id number]   cc:   Neta Mends. Fabian Sharp, MD

## 2010-06-12 NOTE — Letter (Signed)
February 24, 2007    To Whom It May Concern, about jury duty   RE:  Maria, Hull  MRN:  [id number]  /  DOB:  January 26, 1970   Dear Sharee Pimple,   [name] is a patient of mine.  In addition, her family is under care  in our practice.  She currently is the sole caretaker for her mother,  who has cancer, and her husband, who recently had complications of  surgery as well as caring for a young child.   Please excuse her from jury duty currently because of her important  responsibilities to help her family with their medical needs presently.  I suspect that she will be able to perform her duties as a juror in  about 3-6 months or after.  Please let us know if we can provide more  information.    Sincerely,      Neta Mends. Panosh, MD  Electronically Signed    WKP/MedQ  DD: 02/24/2007  DT: 02/24/2007  Job #: [id number]

## 2010-06-12 NOTE — Op Note (Signed)
[name], [name]             ACCOUNT NO.:  [id number]   MEDICAL RECORD NO.:  [id number]          PATIENT TYPE:  AMB   LOCATION:  DSC                          FACILITY:  [facility]   PHYSICIAN:  Leonides Grills, M.D.     DATE OF BIRTH:  October 19, 1970   DATE OF PROCEDURE:  02/10/2005  DATE OF DISCHARGE:                                 OPERATIVE REPORT   PREOPERATIVE DIAGNOSES:  1.  Right hallux valgus.  2.  Right os trigone.  3.  Right ankle impingement.   POSTOPERATIVE DIAGNOSES:  1.  Right hallux valgus.  2.  Right os trigone.  3.  Right ankle impingement.  4.  Lateral talar dome osteochondral lesion.   OPERATION PERFORMED:  1.  Right chevron bunionectomy.  2.  Stress x-rays left foot.  3.  Excision right os trigonum.  4.  Right posterior ankle arthrotomy with tenosynovectomy.  5.  Right ankle arthroscopy with extensive debridement.  6.  Debridement drilling right lateral talar dome osteochondral lesion.   ANESTHESIA:  General.   SURGEON:  Leonides Grills, M.D.   ASSISTANT:  Lianne Cure, P.A.   ESTIMATED BLOOD LOSS:  Minimal.   TOURNIQUET TIME:  Approximately 1-1/2 hours.   COMPLICATIONS:  None.   DISPOSITION:  Stable to PAR.   INDICATIONS FOR PROCEDURE:  The patient is a 40 year old female who has had  longstanding ankle and forefoot pain that is interfering with her life to  the point where she could not do what she wants to do despite conservative  management.  She was consented for the above procedure.  All risks which  include infection, neurovascular injury, nonunion, malunion, hardware  irritation or hardware failure, persistent pain, worsening pain, prolonged  recovery, stiffness, arthritis, sinus formation were all explained.  Questions were encouraged and answered.   DESCRIPTION OF PROCEDURE:  The patient was brought to the operating room and  placed in supine position after adequate general endotracheal tube  anesthesia was administered as well as Ancef 1  g IV piggyback.  The right  lower extremity was then prepped and draped in sterile manner over a  proximally placed thigh tourniquet after the patient was placed in a sloppy  lateral position with the operative side up.  We started the procedure with  a longitudinal incision midline between the posterior aspect of the peroneal  tendons and anterolateral aspect of the Achilles tendon.  Dissection was  carried down through skin and hemostasis was obtained.  Sural nerve was  identified and a formal sural nerve neurolysis was then performed.  Once  this was done and the nerve was retracted out of harm's way, a posterior  ankle arthrotomy was then made. There was a large amount of synovitis in  this area and this was removed with a rongeur taking care not to violate the  FHL tendon.  Once this was done and the os trigone was clearly demarcated, a  curved 1/4 inch osteotome was then used to osteotomize the os trigonum in  the coronal plane.  Once this was done, this was then removed with a  rongeur.  This  was palpated to be completely decompressed and with forced  plantar flexion palpating this area there was no impingement posteriorly as  well.  We then obtained stress x-rays in the lateral views that showed that  this was completely decompressed and with forced plantar flexion there was  no impingement posteriorly.  The area was copiously irrigated with normal  saline.  We left this wound open as an outflow for the scope.  We then  performed the [facility] bunionectomy.  We made a longitudinal incision in the  midline over the medial aspect of the great toe MTP joint.  Dissection was  carried down through the skin and hemostasis was obtained.  Neurovascular  structures were identified both superiorly and inferiorly and protected  throughout the case.  L-shaped capsulotomy was then made.  Simple  bunionectomy was then performed with a sagittal saw.  Rocky Link Johnson's ridge  was then rounded off with a  rongeur.  Lateral capsule was then released with  a curved Beaver blade.  Center of the head was identified.  Soft tissues  were elevated both superiorly and inferiorly and protected throughout the  case.  At the distal aspect of the metatarsal, the chevron osteotomy was  then created.  The head was then translated about 3 to 4 mm laterally and  fixed with a 2.0 mm fully threaded cortical set screw using a 1.5 mm drill  hole respectively. This had excellent purchase and maintenance of the  correction. This was also countersunk.  Redundant bone medially was then  sawed off with a sagittal saw and the area was copiously irrigated with  normal saline.  Capsule was then advanced both superiorly and proximally  with the toe held in a reduced position and this was reconstructed and  repaired with 2-0 Vicryl suture.  Stress x-rays were obtained in the AP and  lateral planes and showed no gross motion across the osteotomy site,  fixation in the proper position as well as correction excellent as well.  Once again, the area was copiously irrigated with normal saline and skin was  closed with 4-0 nylon suture.  We then mapped out anatomic landmarks to  include anterior tibialis tendon and peroneus tertius.  Due to the obesity  of the patient we could not see a superficial peroneal nerve.  Spinal needle  was then placed just medial to the anterior tibialis tendon.  Weston Brass and  spread technique was then utilized to create the anteromedial portal once 20  mL normal saline was placed in the ankle.  This was also verified before it  leaked out posteriorly.  Once the portal was created, a blunt tip trocar  with cannula followed by camera was placed in the ankle. Under direct  visualization the anterolateral portal was then created with a nick and  spread technique just lateral to the peroneus tertius tendon.  Once this was done, we then saw that there was a tremendous amount of synovitis  anterolaterally.   The accessory tib-fib ligament was rubbing the  anterolateral horn of the talar dome raw and subsequently she developed  synovitis in this area.  We then performed an extensive debridement of the  synovitis as well as the accessory tib-fib ligament with a shaver as well as  a bevel.  This took some time initially.  Once this was done and the lateral  gutter was also debrided as well of synovitis, it was found that she did  have an osteochondral lesion where the accessory tib-fib  ligament was  rubbing the anterolateral corner of the talus dome raw. We then debrided the  osteochondral lesion with a House curet as well as a shaver.  Once this was  down to bone, we then placed multiple holes with a microfracture technique  to drill this area.  We decreased inflow as well as the tourniquet at this  point and showed excellent bleeding from this area.  We then switched the  camera into the anterolateral portal and instrumented through the  anteromedial portal.  There was also a large amount of synovitis  anteromedially.  There was no distinct osteochondral lesion and also the  talar dome was also visualized to be without any abnormalities as well.  There was synovitis in the anteromedial corner of the ankle as well as  anteriorly.  Extensive debridement was continued with a bevel as well as  shaver.  Once this was completed, pictures were  obtained throughout the procedure.  Camera was removed. The wounds were  closed with 4-0 nylon suture and the posterolateral wound was closed with 3-  0 Vicryl followed by 4-0 nylon.  Sterile dressing was applied, [employer]  dressing was applied, modified Jones dressing was applied.  The patient was  stable to the PAR.      Leonides Grills, M.D.  Electronically Signed     PB/MEDQ  D:  02/10/2005  T:  02/10/2005  Job:  [id number]

## 2010-07-13 ENCOUNTER — Other Ambulatory Visit: Payer: Self-pay | Admitting: Internal Medicine

## 2010-07-23 ENCOUNTER — Encounter: Payer: Self-pay | Admitting: Internal Medicine

## 2010-07-23 ENCOUNTER — Ambulatory Visit (INDEPENDENT_AMBULATORY_CARE_PROVIDER_SITE_OTHER): Payer: BC Managed Care – PPO | Admitting: Internal Medicine

## 2010-07-23 VITALS — BP 110/80 | HR 66 | Wt 314.0 lb

## 2010-07-23 DIAGNOSIS — D229 Melanocytic nevi, unspecified: Secondary | ICD-10-CM

## 2010-07-23 DIAGNOSIS — D239 Other benign neoplasm of skin, unspecified: Secondary | ICD-10-CM

## 2010-07-23 DIAGNOSIS — F4322 Adjustment disorder with anxiety: Secondary | ICD-10-CM

## 2010-07-23 MED ORDER — DULOXETINE HCL 60 MG PO CPEP: 60.0000 mg | ORAL_CAPSULE | Freq: Every day | ORAL | Status: DC

## 2010-07-23 MED ORDER — LORAZEPAM 0.5 MG PO TABS: 0.5000 mg | ORAL_TABLET | Freq: Three times a day (TID) | ORAL | Status: DC

## 2010-07-23 NOTE — Patient Instructions (Signed)
Avoid sunburn ..can use a little antibiotic ointment on the mole. If the scaling irritation or bleeding continues for another month call and we would get dermatology or [place] to reevaluate.  We can change the Zoloft to Cymbalta.  Gets some increase in symptoms during the transition period. Decreased the Zoloft 150 mg for a week and then 100 mg for 1-2 weeks  Then Change to Cymbalta 60 mg a day.  (If you are feeling badly with nausea and increased anxiety jitteriness  we may wean zoloft slower and switch over to 30 mg a day of Cymbalta.) 60 mg is the therapeutic dose.)  Ok to use lorazepam prn for now.   Agree with family counseling . Stress anxiety can cause  decrease concentration.  Recheck  In 4-5 weeks or as needed

## 2010-07-23 NOTE — Progress Notes (Signed)
  Subjective:    Patient ID: [name], female    DOB: 01/31/1970, 40 y.o.   MRN: [id number]  HPI  Patient comes in today for  Acute visit  For a couple of issues.  Skin: was in sun a got pink  And bleeding at times  For 1- 2 weeks  After picking at it ocass After this  Mole has been there for a long time.   Mood:   On zoloft 200 mg  And still gets anxiety. Hard to concentrate and get stuff done.  Sleep ok.    Husband separated ismoved back in household and life has changed feels safe but  Care with his moods .  He is also on meds  For mood.    Review of Systems Neg fever cp sob fever other bleeding vision hearingg changes see hpi     Objective:   Physical Exam Physical Exam: Vital signs reviewed WUJ:WJXB is a well-developed well-nourished alert cooperative  white female who appears her stated age in no acute distress.  HEENT: normocephalic  traumatic , Eyes: PERRL EOM's full, conjunctiva clear, Nares: paten,t no deformity discharge or tenderness., Ears: no deformity EAC's clear TMs with normal landmarks. Mouth: clear OP, no lesions, edema.  Moist mucous membranes. Dentition in adequate repair. NECK: supple without masses, thyromegaly or bruits. SKIN: No acute rashes normal turgor, color, no bruising or petechiae. Left cheek face with small mole with central excoriation 2-3 mm no alarm features . PSYCH: Oriented, good eye contact, no obvious depression anxiety, cognition and judgment appear normal.   Oriented x 3 and no noted deficits in memory, attention, and speech.       Assessment & Plan:  Mole irritated  For a few weeks.    Avoid scratching and if  persistent or progressive will get derm to see or to recheck but looks benign at present.   Reviewed sun protection.  Reactive anxiety depression  Dec concentration from changing home situation and although coping well agree with tying change in meds to dual action meds.    Also disc at length the situation that would be he;ped  by family counseling if husband agrees.  reviewed how to do the switch from zoloft to cymbalta   60 and then follow up.  Call in meantime if needed .Total visit > 50% spent counseling and coordinating care

## 2010-07-25 ENCOUNTER — Encounter: Payer: Self-pay | Admitting: Internal Medicine

## 2010-07-25 DIAGNOSIS — D229 Melanocytic nevi, unspecified: Secondary | ICD-10-CM | POA: Insufficient documentation

## 2010-07-25 DIAGNOSIS — R233 Spontaneous ecchymoses: Secondary | ICD-10-CM | POA: Insufficient documentation

## 2010-08-26 ENCOUNTER — Ambulatory Visit: Payer: BC Managed Care – PPO | Admitting: Internal Medicine

## 2010-08-27 ENCOUNTER — Encounter: Payer: Self-pay | Admitting: Internal Medicine

## 2010-08-27 ENCOUNTER — Ambulatory Visit (INDEPENDENT_AMBULATORY_CARE_PROVIDER_SITE_OTHER): Payer: BC Managed Care – PPO | Admitting: Internal Medicine

## 2010-08-27 VITALS — BP 100/60 | HR 66 | Wt 311.0 lb

## 2010-08-27 DIAGNOSIS — F4322 Adjustment disorder with anxiety: Secondary | ICD-10-CM

## 2010-08-27 DIAGNOSIS — E669 Obesity, unspecified: Secondary | ICD-10-CM

## 2010-08-27 MED ORDER — DULOXETINE HCL 60 MG PO CPEP: 60.0000 mg | ORAL_CAPSULE | Freq: Every day | ORAL | Status: DC

## 2010-08-27 NOTE — Patient Instructions (Signed)
Continue the cymbalta    Daily. Recheck  In   2-3 months.

## 2010-08-27 NOTE — Progress Notes (Signed)
  Subjective:    Patient ID: [name], female    DOB: [birth year], 40 y.o.   MRN: [id number]  HPI  Comesin today for follow up of anxiety depressive sx after transition from zoloft to  cymbalta .  She is doing much better and feels some herself is adapting to situation at home.  Has been able to lose some weight.  Has been on cymbalta 60 for about 2 weeks total. nosig se except nausea at initiation.  Only taking xanax occassionally now  Review of Systems NO vd drowsiness Past history family history social history reviewed in the electronic medical record.     Objective:   Physical Exam WDWN in nad 3 Oriented x 3 and no noted deficits in memory, attention, and speech. No obv depression or anxiety today.    Record review    Assessment & Plan:  Anxiety depr  Better so far on cymbalta  After transition.   Continue ROV in 2-3 months or  If needed in the meantime. Agree to go ahead with family counseling child and her even if husband doesn't go.

## 2010-09-14 ENCOUNTER — Telehealth: Payer: Self-pay | Admitting: *Deleted

## 2010-09-14 NOTE — Telephone Encounter (Signed)
i dont know  what is less expensive in this category .   Has she checked to see if there is a co pay coupon on line ( or see if we have one here)   And if not ask her pharmacy about pristiq and effexor XR  Cost  For her plan.

## 2010-09-14 NOTE — Telephone Encounter (Signed)
Pt called saying that Cymbalta cost $130.00, after ins. and is too expensive. She needs something that is cheapier.

## 2010-09-15 NOTE — Telephone Encounter (Signed)
Pt aware of this. 

## 2010-09-21 ENCOUNTER — Telehealth: Payer: Self-pay | Admitting: *Deleted

## 2010-09-21 NOTE — Telephone Encounter (Signed)
Would see psychiatry .  Dr Walker Shadow office  Dr Dub Mikes  Office   Both are psychiatrist.

## 2010-09-21 NOTE — Telephone Encounter (Signed)
Pt is wanting to know who would do the testing for borderline personality disorder and bipolar disorder? Can we refer her to someone or do you know someone she can call?

## 2010-09-22 NOTE — Telephone Encounter (Signed)
Pt aware of this. 

## 2010-10-09 ENCOUNTER — Other Ambulatory Visit: Payer: Self-pay | Admitting: Internal Medicine

## 2010-10-12 ENCOUNTER — Other Ambulatory Visit: Payer: Self-pay | Admitting: *Deleted

## 2010-10-23 LAB — DIFFERENTIAL
Basophils Absolute: 0.1
Lymphocytes Relative: 35
Neutro Abs: 3.6

## 2010-10-23 LAB — CK TOTAL AND CKMB (NOT AT ARMC)
CK, MB: 1.5
Relative Index: 1.5

## 2010-10-23 LAB — POCT CARDIAC MARKERS
CKMB, poc: <1 — ABNORMAL LOW
Myoglobin, poc: 66.6

## 2010-10-23 LAB — POCT I-STAT, CHEM 8
Calcium, Ion: 1.12
Glucose, Bld: 106 — ABNORMAL HIGH
HCT: 41
Hemoglobin: 13.9
TCO2: 25

## 2010-10-23 LAB — D-DIMER, QUANTITATIVE: D-Dimer, Quant: 0.23

## 2010-10-23 LAB — CBC
HCT: 41.1
Hemoglobin: 13.8
MCHC: 33.6
RBC: 4.53

## 2010-11-06 ENCOUNTER — Encounter: Payer: Self-pay | Admitting: Internal Medicine

## 2010-11-06 ENCOUNTER — Ambulatory Visit (INDEPENDENT_AMBULATORY_CARE_PROVIDER_SITE_OTHER): Payer: BC Managed Care – PPO | Admitting: Internal Medicine

## 2010-11-06 VITALS — BP 120/80 | HR 66 | Wt 321.0 lb

## 2010-11-06 DIAGNOSIS — F411 Generalized anxiety disorder: Secondary | ICD-10-CM

## 2010-11-06 DIAGNOSIS — R4589 Other symptoms and signs involving emotional state: Secondary | ICD-10-CM

## 2010-11-06 DIAGNOSIS — E039 Hypothyroidism, unspecified: Secondary | ICD-10-CM

## 2010-11-06 DIAGNOSIS — F410 Panic disorder [episodic paroxysmal anxiety] without agoraphobia: Secondary | ICD-10-CM | POA: Insufficient documentation

## 2010-11-06 DIAGNOSIS — R7309 Other abnormal glucose: Secondary | ICD-10-CM

## 2010-11-06 DIAGNOSIS — F4320 Adjustment disorder, unspecified: Secondary | ICD-10-CM

## 2010-11-06 LAB — BASIC METABOLIC PANEL
CO2: 28 mEq/L (ref 19–32)
GFR: 61.43 mL/min (ref >60.00)
Glucose, Bld: 109 mg/dL — ABNORMAL HIGH (ref 70–99)
Potassium: 4.6 mEq/L (ref 3.5–5.1)
Sodium: 141 mEq/L (ref 135–145)
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
## 2010-11-06 NOTE — Patient Instructions (Signed)
Will notify you  of labs when available. Need to have you talk to your counselor.  No change in meds at this time .  Ok to take the lorazepam as needed but not when driving.

## 2010-11-09 ENCOUNTER — Encounter: Payer: Self-pay | Admitting: *Deleted

## 2010-11-10 LAB — URINE CULTURE

## 2010-11-10 LAB — URINE MICROSCOPIC-ADD ON

## 2010-11-10 LAB — URINALYSIS, ROUTINE W REFLEX MICROSCOPIC
Glucose, UA: NEGATIVE
Specific Gravity, Urine: 1.018
Urobilinogen, UA: 1

## 2010-11-11 LAB — I-STAT 8, (EC8 V) (CONVERTED LAB)
Acid-base deficit: 1
BUN: 13
Bicarbonate: 24.3 — ABNORMAL HIGH
Chloride: 107
Glucose, Bld: 114 — ABNORMAL HIGH
HCT: 47 — ABNORMAL HIGH
Hemoglobin: 16 — ABNORMAL HIGH
Operator id: 277751
Potassium: 3.6
Sodium: 139
TCO2: 26
pCO2, Ven: 40.9 — ABNORMAL LOW
pH, Ven: 7.382 — ABNORMAL HIGH

## 2010-11-11 LAB — URINALYSIS, ROUTINE W REFLEX MICROSCOPIC
Nitrite: POSITIVE — AB
Specific Gravity, Urine: 1.026
pH: 6

## 2010-11-11 LAB — URINE MICROSCOPIC-ADD ON

## 2010-11-11 LAB — POCT I-STAT CREATININE
Creatinine, Ser: 1.1
Operator id: 277751

## 2010-11-12 ENCOUNTER — Other Ambulatory Visit: Payer: Self-pay | Admitting: Internal Medicine

## 2010-11-12 ENCOUNTER — Encounter: Payer: Self-pay | Admitting: Family Medicine

## 2010-11-12 ENCOUNTER — Ambulatory Visit (INDEPENDENT_AMBULATORY_CARE_PROVIDER_SITE_OTHER): Payer: BC Managed Care – PPO | Admitting: Family Medicine

## 2010-11-12 VITALS — BP 130/88 | HR 80 | Temp 98.1°F | Resp 12 | Wt 318.0 lb

## 2010-11-12 DIAGNOSIS — R0789 Other chest pain: Secondary | ICD-10-CM

## 2010-11-12 MED ORDER — LORAZEPAM 0.5 MG PO TABS: 0.5000 mg | ORAL_TABLET | Freq: Three times a day (TID) | ORAL | Status: DC

## 2010-11-12 MED ORDER — DULOXETINE HCL 60 MG PO CPEP: 60.0000 mg | ORAL_CAPSULE | Freq: Every day | ORAL | Status: DC

## 2010-11-12 NOTE — Telephone Encounter (Signed)
rx called in

## 2010-11-12 NOTE — Patient Instructions (Signed)
Follow up immediately for any worsening chest symptoms or new symptoms.

## 2010-11-12 NOTE — Telephone Encounter (Signed)
Ok to refill both?? 

## 2010-11-12 NOTE — Telephone Encounter (Signed)
Pt need refill on cymbalta 60mg  and lorazepam 0.5mg  call into [facility] [phone]. Pt has 2 pills left of lorazepam
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
## 2010-12-04 ENCOUNTER — Ambulatory Visit (INDEPENDENT_AMBULATORY_CARE_PROVIDER_SITE_OTHER): Payer: BC Managed Care – PPO | Admitting: Internal Medicine

## 2010-12-04 ENCOUNTER — Encounter: Payer: Self-pay | Admitting: Internal Medicine

## 2010-12-04 VITALS — BP 120/80 | HR 60 | Wt 309.0 lb

## 2010-12-04 DIAGNOSIS — R4589 Other symptoms and signs involving emotional state: Secondary | ICD-10-CM

## 2010-12-04 DIAGNOSIS — F4389 Other reactions to severe stress: Secondary | ICD-10-CM

## 2010-12-04 DIAGNOSIS — F410 Panic disorder [episodic paroxysmal anxiety] without agoraphobia: Secondary | ICD-10-CM

## 2010-12-04 DIAGNOSIS — F438 Other reactions to severe stress: Secondary | ICD-10-CM

## 2010-12-04 DIAGNOSIS — F4322 Adjustment disorder with anxiety: Secondary | ICD-10-CM

## 2010-12-04 DIAGNOSIS — Z23 Encounter for immunization: Secondary | ICD-10-CM

## 2010-12-04 MED ORDER — LORAZEPAM 0.5 MG PO TABS: 0.5000 mg | ORAL_TABLET | Freq: Three times a day (TID) | ORAL | Status: DC
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
## 2010-12-05 NOTE — Patient Instructions (Signed)
Continue as discussed and counseling and support system  Use ativan as needed. Fu in 2 months or if needed.

## 2010-12-29 ENCOUNTER — Telehealth: Payer: Self-pay | Admitting: Internal Medicine

## 2010-12-29 MED ORDER — LORAZEPAM 0.5 MG PO TABS: 0.5000 mg | ORAL_TABLET | Freq: Three times a day (TID) | ORAL | Status: DC
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
## 2010-12-29 NOTE — Telephone Encounter (Signed)
Pt called and has misplaced her written script for LORazepam (ATIVAN) 0.5 MG tablet. Pt is req a new script to be called in to [facility] on [address]. Pt says that this is an emergency.

## 2011-01-08 ENCOUNTER — Encounter: Payer: Self-pay | Admitting: Family Medicine

## 2011-01-08 ENCOUNTER — Ambulatory Visit (INDEPENDENT_AMBULATORY_CARE_PROVIDER_SITE_OTHER): Payer: BC Managed Care – PPO | Admitting: Family Medicine

## 2011-01-08 VITALS — BP 120/80 | HR 87 | Temp 98.1°F | Wt 315.0 lb

## 2011-01-08 DIAGNOSIS — J209 Acute bronchitis, unspecified: Secondary | ICD-10-CM

## 2011-01-08 DIAGNOSIS — J040 Acute laryngitis: Secondary | ICD-10-CM

## 2011-01-08 MED ORDER — GUAIFENESIN-CODEINE 100-10 MG/5ML PO SYRP: 5.0000 mL | ORAL_SOLUTION | Freq: Two times a day (BID) | ORAL | Status: DC | PRN

## 2011-01-08 MED ORDER — METHYLPREDNISOLONE ACETATE 80 MG/ML IJ SUSP
80.0000 mg | Freq: Once | INTRAMUSCULAR | Status: AC
  Administered 2011-01-08: 80 mg via INTRAMUSCULAR

## 2011-01-08 NOTE — Progress Notes (Signed)
Subjective:    Patient ID: [name], female    DOB: 07-12-1970, 40 y.o.   MRN: [id number]  HPI A 40 year old white female, in with complaints of hoarseness and a cough going on for 4 days. As a result of the cough she can't sleep at night and she's been increasingly fatigued. She's been trying over-the-counter Delsym and Mucinex with no relief. She denies any muscle aches or pain, no fever, shortness of breath, chest pain, nausea or vomiting, or headaches.   Review of Systems As stated above    Past Medical History  Diagnosis Date  . Hypothyroid   . Anxiety     reactive   . Allergy   . History of nephrolithiasis 11/2008    under urology care uric acid rx ?  . Varicose veins     History   Social History  . Marital Status: Married    Spouse Name: N/A    Number of Children: N/A  . Years of Education: N/A   Occupational History  . Not on file.   Social History Main Topics  . Smoking status: Never Smoker   . Smokeless tobacco: Not on file  . Alcohol Use: No  . Drug Use: No  . Sexually Active: Not on file   Other Topics Concern  . Not on file   Social History Narrative   SeparatedH H   3 son and  niece Husband  Visit a couple times per week. Living in [place]  Now moving back in to homeNot working out of house.Financially dependent on husband for insurance etc.Childbirth x 1 son to enter middle school some academics  problems    Past Surgical History  Procedure Date  . Carpal tunnel release   . Rt knee surgery 08/1987  . Foot surgery   . Cholecystectomy   . Lithotripsy     twice by Dr. Patsi Sears  . Teeth extracted     on top for decay    Family History  Problem Relation Age of Onset  . Breast cancer Mother     deceased 2006-06-23 2007/06/23  . ADD / ADHD Son     Allergies  Allergen Reactions  . Nalbuphine   . Ondansetron     Current Outpatient Prescriptions on File Prior to Visit  Medication Sig Dispense Refill  . DULoxetine (CYMBALTA) 60 MG capsule Take  1 capsule (60 mg total) by mouth daily.  30 capsule  5  . Eszopiclone (ESZOPICLONE) 3 MG TABS Take 3 mg by mouth at bedtime. Take immediately before bedtime       . fluocinolone (VANOS) 0.01 % cream Apply topically 2 (two) times daily.        . fluticasone (FLONASE) 50 MCG/ACT nasal spray 2 sprays by Nasal route daily.        Marland Kitchen levothyroxine (SYNTHROID, LEVOTHROID) 112 MCG tablet TAKE 1 TABLET BY MOUTH DAILY  60 tablet  3  . LORazepam (ATIVAN) 0.5 MG tablet Take 1 tablet (0.5 mg total) by mouth every 8 (eight) hours. 1-2 every 6 hours as needed  30 tablet  0   No current facility-administered medications on file prior to visit.    BP 120/80  Pulse 87  Temp(Src) 98.1 F (36.7 C) (Oral)  Wt 315 lb (142.883 kg)  SpO2 99%chart Objective:   Physical Exam  Constitutional: Alert and oriented in no acute distress, obese ENT: Ears are clear pharynx slightly red and, no sinus tenderness to palpation Neck: No thyromegaly masses or bruits, no  lymphadenopathy Lungs: Clear to auscultation Cardiac: Regular rate and rhythm, no murmurs rubs or gallops Skin: Warm and dry, no cyanosis Psychiatric: Normal mood and affect     Assessment & Plan:  Assessment: Laryngitis  Plan: Depo-Medrol 80 mg IM x1. Continue over-the-counter medications. Call her symptoms worsen or persist. Recheck as scheduled, sooner when necessary

## 2011-01-08 NOTE — Patient Instructions (Signed)
1. OTC meds as you have been doing  Laryngitis At the top of your windpipe is your voice box. It is the source of your voice. Inside your voice box are 2 bands of muscles called vocal cords. When you breathe, your vocal cords are relaxed and open so that air can get into the lungs. When you decide to say something, these cords come together and vibrate. The sound from these vibrations goes into your throat and comes out through your mouth as sound. Laryngitis is an inflammation of the vocal cords that causes hoarseness, cough, loss of voice, sore throat, and dry throat. Laryngitis can often be related to a viral infection, excessive smoking, excessive talking or yelling, inhalation of toxic fumes, allergies, and reflux of acid from your stomach. CAUSES Laryngitis can be temporary (acute) or long-term (chronic). Most cases of acute laryngitis improve with time. The typical cause of acute laryngitis is viral infection, vocal strain, measles or mumps, or bacterial infections. Chronic laryngitis lasts for more than 3 weeks. It is usually caused by vocal cord strain, vocal cord injury, postnasal drip, growths on the vocal cords, or acid reflux. RISK FACTORS  Respiratory infections.   Exposure to irritating substances, such as cigarette smoke, excessive amounts of alcohol, stomach acids, and workplace chemicals.   Voice trauma, such as vocal cord injury from shouting or speaking too loud.  DIAGNOSIS  The most common sign of laryngitis is hoarseness. This can range from partial to total loss of your voice. Laryngoscopy may be necessary. This allows your caregiver to look into the larynx in order to diagnose your condition. HOME CARE INSTRUCTIONS  Drink enough fluids to keep your urine clear or pale yellow.   Rest until you no longer have symptoms or as directed by your caregiver.   Breathe in moist air.   Take all medicine as directed by your caregiver.   Do not smoke.   Talk as little as  possible (this includes whispering).   Write on paper instead of talking until your voice is back to normal.   Follow up with your caregiver if your condition has not improved after 10 days.  SEEK MEDICAL CARE IF:   You have trouble breathing.   You cough up blood.   You have persistent fever.   You have increasing pain.   You have difficulty swallowing.  Document Released: 01/11/2005 Document Revised: 09/23/2010 Document Reviewed: 03/19/2010 [facility] Patient Information 2012 [place], [place].

## 2011-02-03 ENCOUNTER — Encounter: Payer: Self-pay | Admitting: Internal Medicine

## 2011-02-03 ENCOUNTER — Ambulatory Visit (INDEPENDENT_AMBULATORY_CARE_PROVIDER_SITE_OTHER): Payer: BC Managed Care – PPO | Admitting: Internal Medicine

## 2011-02-03 VITALS — BP 120/80 | HR 60 | Wt 318.0 lb

## 2011-02-03 DIAGNOSIS — F4322 Adjustment disorder with anxiety: Secondary | ICD-10-CM

## 2011-02-03 DIAGNOSIS — F438 Other reactions to severe stress: Secondary | ICD-10-CM

## 2011-02-03 NOTE — Progress Notes (Signed)
  Subjective:    Patient ID: [name], female    DOB: January 24, 1971, 41 y.o.   MRN: [id number]  HPI Fu of anxiety . 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  Child is getting assessment at [facility].  AAdjustment of   Anxiety.   And his add.  Getting help  Cymbalta 60 at night    Helped a lot with problems  Had illness in December and laryngitis and cough .  Still slightly hoarse  .   Sleeping ok   Rare need for ativan.   Review of Systems Neg cp sob panic attackts gi gu issues today  Past history family history social history reviewed in the electronic medical record.     Objective:   Physical Exam  WDWN in nad looks well today minimally hoarse  HEENT: Normocephalic ;atraumatic , Eyes;  PERRL, EOMs  Full, lids and conjunctiva clear,,Ears: no deformities, canals nl, TM landmarks normal, Nose: no deformity or discharge  Mouth : OP clear without lesion or edema . Neck: Supple without adenopathy or masses or bruits Chest:  Clear to A&P without wheezes rales or rhonchi CV:  S1-S2 no gallops or murmurs peripheral perfusion is normal Oriented x 3. Normal cognition, attention, speech. Not anxious or depressed appearing   Good eye contact .       Assessment & Plan:  adjustment reaction   Anxiety doing much better with external supports and cymbalta helping  Rare need for ativan rescue   RTI  Much better slight  Hoarseness remains normal exam otherwise     Expectant management. Continue  Call with alarm features or prn.  CPX PV and labs in 6 months or as needed

## 2011-02-03 NOTE — Patient Instructions (Signed)
Continue sam medications  As you are doing. Preventive visit in  6 months.   Or as needed

## 2011-02-18 ENCOUNTER — Other Ambulatory Visit: Payer: Self-pay | Admitting: Internal Medicine

## 2011-02-19 ENCOUNTER — Other Ambulatory Visit: Payer: Self-pay | Admitting: Internal Medicine

## 2011-03-02 ENCOUNTER — Telehealth: Payer: Self-pay | Admitting: Internal Medicine

## 2011-03-02 NOTE — Telephone Encounter (Signed)
Pt would like samples of cymbalta 60mg. °

## 2011-03-02 NOTE — Telephone Encounter (Signed)
Ok to give some if we have any

## 2011-03-02 NOTE — Telephone Encounter (Signed)
No samples in the building. Left message on machine about this.

## 2011-03-09 ENCOUNTER — Telehealth: Payer: Self-pay | Admitting: Family Medicine

## 2011-03-09 NOTE — Telephone Encounter (Signed)
Patient's prior auth for Cymbalta will be denied unless: documented contraindication to Effexor, OR the patient has tried and failed Effexor first. Please advise. Thanks.

## 2011-03-09 NOTE — Telephone Encounter (Signed)
Pt has tried zoloft and wellbutrin.

## 2011-03-10 NOTE — Telephone Encounter (Signed)
Need office visit to decide on what to do.

## 2011-03-11 NOTE — Telephone Encounter (Signed)
Pt aware of this. Pt to call back to make an appt.

## 2011-03-18 ENCOUNTER — Encounter: Payer: Self-pay | Admitting: Internal Medicine

## 2011-03-18 ENCOUNTER — Ambulatory Visit (INDEPENDENT_AMBULATORY_CARE_PROVIDER_SITE_OTHER): Payer: BC Managed Care – PPO | Admitting: Internal Medicine

## 2011-03-18 VITALS — BP 110/70 | HR 66 | Wt 317.0 lb

## 2011-03-18 DIAGNOSIS — F438 Other reactions to severe stress: Secondary | ICD-10-CM

## 2011-03-18 DIAGNOSIS — E039 Hypothyroidism, unspecified: Secondary | ICD-10-CM

## 2011-03-18 DIAGNOSIS — F4322 Adjustment disorder with anxiety: Secondary | ICD-10-CM

## 2011-03-18 DIAGNOSIS — F411 Generalized anxiety disorder: Secondary | ICD-10-CM

## 2011-03-18 MED ORDER — LORAZEPAM 0.5 MG PO TABS: 0.5000 mg | ORAL_TABLET | Freq: Three times a day (TID) | ORAL | Status: DC

## 2011-03-18 MED ORDER — VENLAFAXINE HCL ER 75 MG PO CP24: 75.0000 mg | ORAL_CAPSULE | Freq: Every day | ORAL | Status: DC

## 2011-03-18 NOTE — Patient Instructions (Signed)
We can try switch  To effexor XR for now  Straight switch to 75 mg XR. Each day  If getting increase  Anxiety.   Then increase to 150 xr  mg per day.   ROV in  1 month.  Or as needed

## 2011-03-18 NOTE — Progress Notes (Signed)
  Subjective:    Patient ID: MAJESTA LEICHTER, female    DOB: [birth year], 41 y.o.   MRN: [id number]  HPI Patient comes in today for followup of medication management for her anxiety depression adjustment reaction and panic situation. Going to [facility]  diagnosis Adjustment  anxiety and depression and ptsd.   She's been doing very well on Cymbalta and as needed lorazepam. However her insurance will not pay for his Cymbalta that costs $55 a week. They want [place] to try another medication such as Effexor X. are first. She has not been on this medication but is a bit anxious about switching because she is doing much better although many other factors are causing stress in her life. Review of Systems No chest pain shortness of breath major changes in vision see above  Past history family history social history reviewed in the electronic medical record.     Objective:   Physical Exam WDWN in nad  Looks well without significant anxiety depression today normal speech I contact and appropriate for situation. Looks much more relaxed. Confident in her speech.     Assessment & Plan:  Reactive adjustment disorder with anxiety depression seemingly well controlled on medication and getting appropriate support.  We'll refill her lorazepam to use as needed Continue the counseling Discussed medication risk benefit of changing. We will try to switch to Effexor XR 75 mg one a day she may need to increase it to 150 mg a day and see how she does compared to the Cymbalta.  If she has significant side effects or is not working as well this should be reported to her insurance company and hopefully they would pay for appropriate medication. Into any other intervention.  Patient is aware.  Total visit > 50% spent counseling and coordinating care

## 2011-04-15 ENCOUNTER — Ambulatory Visit (INDEPENDENT_AMBULATORY_CARE_PROVIDER_SITE_OTHER): Payer: BC Managed Care – PPO | Admitting: Internal Medicine

## 2011-04-15 ENCOUNTER — Encounter: Payer: Self-pay | Admitting: Internal Medicine

## 2011-04-15 VITALS — BP 110/70 | HR 72 | Wt 318.0 lb

## 2011-04-15 DIAGNOSIS — F4322 Adjustment disorder with anxiety: Secondary | ICD-10-CM

## 2011-04-15 DIAGNOSIS — M791 Myalgia, unspecified site: Secondary | ICD-10-CM | POA: Insufficient documentation

## 2011-04-15 DIAGNOSIS — IMO0001 Reserved for inherently not codable concepts without codable children: Secondary | ICD-10-CM

## 2011-04-15 DIAGNOSIS — Z79899 Other long term (current) drug therapy: Secondary | ICD-10-CM | POA: Insufficient documentation

## 2011-04-15 DIAGNOSIS — Z5189 Encounter for other specified aftercare: Secondary | ICD-10-CM

## 2011-04-15 MED ORDER — VENLAFAXINE HCL ER 150 MG PO CP24: 150.0000 mg | ORAL_CAPSULE | Freq: Every day | ORAL | Status: DC

## 2011-04-15 NOTE — Patient Instructions (Signed)
Increase the effexor to 150

## 2011-04-15 NOTE — Progress Notes (Signed)
  Subjective:    Patient ID: [name], female    DOB: Feb 12, 1970, 41 y.o.   MRN: [id number]  HPI Fu after switch from cymbalta  because of health plans denial of coverage. She is now taking 75 mg a day and she has noticedDecrease energy level more run down and  Achy.    Like when on zoloft.  Nerve pain  In leg has come back mostly left   .   Feels like weights on muscles.    Neck and tension  In shoulders . Mood is so-so. No major life changes going to therapy as well as her child.  This months use of lorazepam. 2-3 times at most Sleep is worse.  Somewhat.   Night mares.   Under therapy.  Review of Systems Negative for chest pain shortness of breath a panic attack increase  Past history family history social history reviewed in the electronic medical record.     Objective:   Physical Exam WDWN in nad    Mildly uncomfortable looks a bit less animated but normal speech and affect. Good insight.     Assessment & Plan:  Mood :not as good as on cymbalta   MS symptoms that had disappeared on Cymbalta R. back .    Migraine recurring and ultrasensitive to light .   Worse.  Then previous.  Will increase Effexor to 150 mg a day XR and have her come back in about a month if not back to baseline  advise we switch back to Cymbalta she did much better on not. Hopefully her insurance would cover that medication.

## 2011-04-27 ENCOUNTER — Telehealth: Payer: Self-pay | Admitting: Internal Medicine

## 2011-04-27 MED ORDER — AMOXICILLIN 875 MG PO TABS: 875.0000 mg | ORAL_TABLET | Freq: Three times a day (TID) | ORAL | Status: AC

## 2011-04-27 NOTE — Telephone Encounter (Signed)
Called pt to get more information on symptoms.  Left a message for pt to return call.

## 2011-04-27 NOTE — Telephone Encounter (Signed)
Per Dr. Fabian Sharp called pt to get more information.  Pt states her fever broke 100 degrees, and she has been taking a decongestant for about 5 days, and using nasal spray for 2 days.  Pt state the facial pain has been for 3 days.  Per Dr. Fabian Sharp called in amoxicillin 875 tid x 10 days.  Pt aware.

## 2011-04-27 NOTE — Telephone Encounter (Signed)
Pt called and has a sinus inf. Pt has had these symptoms a couple of wks. Pt req to get an abx called in to [facility] on [address]. Pt said that she can not afford to come in for ov at this time.

## 2011-04-27 NOTE — Telephone Encounter (Signed)
Saw on 04/15/11 on the 23rd it started.  Pt states this started out like it always does with Itchy eyes and stuffy nose; last few days extreme pain and pressure in face, mucus thick green color coming out of sinuses.  Pt is coughing, congestion, fever, chills, body aches. Pls advise.

## 2011-05-17 ENCOUNTER — Ambulatory Visit (INDEPENDENT_AMBULATORY_CARE_PROVIDER_SITE_OTHER): Payer: BC Managed Care – PPO | Admitting: Internal Medicine

## 2011-05-17 ENCOUNTER — Encounter: Payer: Self-pay | Admitting: Internal Medicine

## 2011-05-17 VITALS — BP 114/70 | HR 92 | Temp 98.8°F | Wt 313.0 lb

## 2011-05-17 DIAGNOSIS — M25519 Pain in unspecified shoulder: Secondary | ICD-10-CM

## 2011-05-17 DIAGNOSIS — Z5189 Encounter for other specified aftercare: Secondary | ICD-10-CM

## 2011-05-17 DIAGNOSIS — F4322 Adjustment disorder with anxiety: Secondary | ICD-10-CM

## 2011-05-17 DIAGNOSIS — Z79899 Other long term (current) drug therapy: Secondary | ICD-10-CM

## 2011-05-17 MED ORDER — DULOXETINE HCL 60 MG PO CPEP
60.0000 mg | ORAL_CAPSULE | Freq: Every day | ORAL | Status: DC
Start: 2011-05-17 — End: 2011-09-02

## 2011-05-17 NOTE — Patient Instructions (Addendum)
We need to have  You change back to cymbalta .   For .  Hopefully  This will help the pain and help with sleep.  And mood  Better than effexor.   ROV in 1 month after changing

## 2011-05-17 NOTE — Progress Notes (Signed)
  Subjective:    Patient ID: [name], female    DOB: [birth year], 41 y.o.   MRN: [id number]  HPI Pt comesin today for follow up  After change in med because of  Insurance directive  Increase sleepless ness. On current meds . Pain in shoulders  And neck muscle pain  To upper  Back. ocass hips hurt.  This was better   On cymbalta.  ocass use of benzo not often Still in counseling  Helping.   Review of Systems No fever weight change syncope   Bleeding   New gi gu issues Past history family history social history reviewed in the electronic medical record.     Objective:   Physical Exam BP 114/70  Pulse 92  Temp(Src) 98.8 F (37.1 C) (Oral)  Wt 313 lb (141.976 kg)  SpO2 97%  LMP 04/28/2011 WDWN in nad Oriented x 3 and no noted deficits in memory, attention, and speech. mildly uncomfortable   UE neck trapezius tightness  Mood slightly anxious.   But articular  Outpatient Prescriptions Prior to Visit  Medication Sig Dispense Refill  . fluocinolone (VANOS) 0.01 % cream Apply topically 2 (two) times daily.        . fluticasone (FLONASE) 50 MCG/ACT nasal spray 2 sprays by Nasal route daily.        Marland Kitchen levothyroxine (SYNTHROID, LEVOTHROID) 112 MCG tablet TAKE 1 TABLET BY MOUTH DAILY  60 tablet  3  . LORazepam (ATIVAN) 0.5 MG tablet Take 1 tablet (0.5 mg total) by mouth every 8 (eight) hours. 1-2 every 6 hours as needed  30 tablet  0  . venlafaxine (EFFEXOR-XR) 150 MG 24 hr capsule Take 1 capsule (150 mg total) by mouth daily.  30 capsule  2        Assessment & Plan:  Mood; some help  With effexor but not optimum not as  good ;sleep is worse  Decrease concentration and focus still in counseling MS pain :  Worse on effexor.  Failure  On meds and aggravation.  Or ms sx .  Advise return to cymbalta and reassess,   Total visit > 50% spent counseling and coordinating care

## 2011-06-22 ENCOUNTER — Encounter: Payer: Self-pay | Admitting: Internal Medicine

## 2011-06-22 ENCOUNTER — Ambulatory Visit (INDEPENDENT_AMBULATORY_CARE_PROVIDER_SITE_OTHER): Payer: BC Managed Care – PPO | Admitting: Internal Medicine

## 2011-06-22 VITALS — BP 112/80 | HR 77 | Temp 98.4°F | Wt 306.0 lb

## 2011-06-22 DIAGNOSIS — Z79899 Other long term (current) drug therapy: Secondary | ICD-10-CM

## 2011-06-22 DIAGNOSIS — F4322 Adjustment disorder with anxiety: Secondary | ICD-10-CM

## 2011-06-22 DIAGNOSIS — M791 Myalgia, unspecified site: Secondary | ICD-10-CM

## 2011-06-22 DIAGNOSIS — IMO0001 Reserved for inherently not codable concepts without codable children: Secondary | ICD-10-CM

## 2011-06-22 DIAGNOSIS — E669 Obesity, unspecified: Secondary | ICD-10-CM

## 2011-06-22 DIAGNOSIS — Z5189 Encounter for other specified aftercare: Secondary | ICD-10-CM

## 2011-06-22 NOTE — Patient Instructions (Addendum)
Continue on cymbalta.  No change  Look into    cymbalta program assistance.   Keep cpx appt with lab.

## 2011-06-22 NOTE — Progress Notes (Signed)
  Subjective:    Patient ID: [name], female    DOB: Jul 28, 1970, 41 y.o.   MRN: [id number]  HPI Pt comesin for fu of med management  She has been back on the cymbalta . MS pain is better  An not resolved but better  About 80 % .  Now able to bowl and walking . Mood  Is better and stress relief is some better  Also divorce is moving along.  Some sleep disturbance but better than before . Decrease panic .  Review of Systems No current cp sob   No injury.  hsa more energy  Past history family history social history reviewed in the electronic medical record.    Objective:   Physical Exam  BP 112/80  Pulse 77  Temp(Src) 98.4 F (36.9 C) (Oral)  Wt 306 lb (138.801 kg)  SpO2 98% WDWN in nad Improved affect. Oriented x 3. Normal cognition, attention, speech. Not anxious or depressed appearing   Good eye contact . Wt Readings from Last 3 Encounters:  06/22/11 306 lb (138.801 kg)  05/17/11 313 lb (141.976 kg)  04/15/11 318 lb (144.244 kg)       Assessment & Plan:  Mood better    3 weeks without ativan  Need.  Less stress eating   losing weight. MS   And   djd  Better . Weight 7# down.  Total visit > 50% spent counseling and coordinating care

## 2011-07-12 ENCOUNTER — Other Ambulatory Visit: Payer: Self-pay | Admitting: Internal Medicine

## 2011-07-12 NOTE — Telephone Encounter (Signed)
The pt was last seen on 06/22/11 and has a future appt for 08/06/11.

## 2011-07-14 NOTE — Telephone Encounter (Signed)
Ok to refill the synthroid for 90 days refill x 1   Refill the ativan x 1  Disp 30

## 2011-07-16 ENCOUNTER — Other Ambulatory Visit: Payer: Self-pay | Admitting: Family Medicine

## 2011-07-16 MED ORDER — LORAZEPAM 0.5 MG PO TABS: 0.5000 mg | ORAL_TABLET | Freq: Three times a day (TID) | ORAL | Status: DC

## 2011-07-16 MED ORDER — LEVOTHYROXINE SODIUM 112 MCG PO TABS: 112.0000 ug | ORAL_TABLET | Freq: Every day | ORAL | Status: DC

## 2011-07-16 NOTE — Telephone Encounter (Signed)
Pt last seen on 06-22-11 and has a future appt for 08/06/11.  Please advise.

## 2011-07-16 NOTE — Telephone Encounter (Signed)
Sent to the pharmacy with 0 refills.  Pt has appt on 08/06/11.

## 2011-07-27 ENCOUNTER — Other Ambulatory Visit (INDEPENDENT_AMBULATORY_CARE_PROVIDER_SITE_OTHER): Payer: BC Managed Care – PPO

## 2011-07-27 DIAGNOSIS — Z0000 Encounter for general adult medical examination without abnormal findings: Secondary | ICD-10-CM

## 2011-07-27 LAB — POCT URINALYSIS DIPSTICK
Bilirubin, UA: NEGATIVE
Glucose, UA: NEGATIVE
Ketones, UA: NEGATIVE
pH, UA: 6

## 2011-07-27 LAB — HEPATIC FUNCTION PANEL
AST: 14 U/L (ref 0–37)
Albumin: 3.9 g/dL (ref 3.5–5.2)

## 2011-07-27 LAB — BASIC METABOLIC PANEL
CO2: 27 mEq/L (ref 19–32)
Chloride: 106 mEq/L (ref 96–112)
Glucose, Bld: 114 mg/dL — ABNORMAL HIGH (ref 70–99)
Potassium: 3.8 mEq/L (ref 3.5–5.1)
Sodium: 142 mEq/L (ref 135–145)

## 2011-07-27 LAB — CBC WITH DIFFERENTIAL/PLATELET
Basophils Absolute: 0 10*3/uL (ref 0.0–0.1)
Eosinophils Absolute: 0.2 10*3/uL (ref 0.0–0.7)
HCT: 40.5 % (ref 36.0–46.0)
Hemoglobin: 13.5 g/dL (ref 12.0–15.0)
Lymphs Abs: 1.5 10*3/uL (ref 0.7–4.0)
MCHC: 33.5 g/dL (ref 30.0–36.0)
MCV: 90.6 fl (ref 78.0–100.0)
Neutro Abs: 3 10*3/uL (ref 1.4–7.7)
RDW: 15.4 % — ABNORMAL HIGH (ref 11.5–14.6)

## 2011-07-27 LAB — LIPID PANEL
HDL: 44.7 mg/dL (ref >39.00)
Triglycerides: 70 mg/dL (ref 0.0–149.0)

## 2011-07-27 LAB — TSH: TSH: 1.87 u[IU]/mL (ref 0.35–5.50)

## 2011-08-06 ENCOUNTER — Encounter: Payer: BC Managed Care – PPO | Admitting: Internal Medicine

## 2011-08-06 DIAGNOSIS — Z0289 Encounter for other administrative examinations: Secondary | ICD-10-CM

## 2011-09-02 ENCOUNTER — Emergency Department (HOSPITAL_COMMUNITY): Payer: BC Managed Care – PPO

## 2011-09-02 ENCOUNTER — Encounter (HOSPITAL_COMMUNITY): Payer: Self-pay | Admitting: Emergency Medicine

## 2011-09-02 ENCOUNTER — Emergency Department (HOSPITAL_COMMUNITY)
Admission: EM | Admit: 2011-09-02 | Discharge: 2011-09-03 | Disposition: A | Discharge status: Home / Self Care | Payer: BC Managed Care – PPO | Attending: Emergency Medicine | Admitting: Emergency Medicine

## 2011-09-02 DIAGNOSIS — N390 Urinary tract infection, site not specified: Secondary | ICD-10-CM | POA: Insufficient documentation

## 2011-09-02 DIAGNOSIS — E039 Hypothyroidism, unspecified: Secondary | ICD-10-CM | POA: Insufficient documentation

## 2011-09-02 DIAGNOSIS — N200 Calculus of kidney: Secondary | ICD-10-CM

## 2011-09-02 DIAGNOSIS — N201 Calculus of ureter: Secondary | ICD-10-CM | POA: Insufficient documentation

## 2011-09-02 DIAGNOSIS — R109 Unspecified abdominal pain: Secondary | ICD-10-CM | POA: Insufficient documentation

## 2011-09-02 LAB — POCT PREGNANCY, URINE: Preg Test, Ur: NEGATIVE

## 2011-09-02 LAB — URINE MICROSCOPIC-ADD ON

## 2011-09-02 LAB — URINALYSIS, ROUTINE W REFLEX MICROSCOPIC
Glucose, UA: NEGATIVE mg/dL
pH: 6 (ref 5.0–8.0)

## 2011-09-02 MED ORDER — KETOROLAC TROMETHAMINE 60 MG/2ML IM SOLN
60.0000 mg | Freq: Once | INTRAMUSCULAR | Status: AC
  Administered 2011-09-02: 60 mg via INTRAMUSCULAR
  Filled 2011-09-02: qty 2

## 2011-09-02 MED ORDER — CEFTRIAXONE SODIUM 1 G IJ SOLR
1.0000 g | Freq: Once | INTRAMUSCULAR | Status: AC
  Administered 2011-09-03: 1 g via INTRAMUSCULAR
  Filled 2011-09-02: qty 10

## 2011-09-02 MED ORDER — HYDROMORPHONE HCL PF 2 MG/ML IJ SOLN
2.0000 mg | Freq: Once | INTRAMUSCULAR | Status: AC
  Administered 2011-09-02: 2 mg via INTRAMUSCULAR
  Filled 2011-09-02: qty 1

## 2011-09-02 NOTE — ED Notes (Signed)
Was on the process to triage Pt. But pt. Refused to answer questions and upset for the " long process before she can get medicated." this Nurse explained that the information is needed for her care . Pt. Also refused for IV access or any lab works. Will notify MD. Charge Nurse aware.

## 2011-09-02 NOTE — ED Notes (Signed)
Pt. Still refused to answer questions, uncooperative for procedures, claimed that medication received "was not really helping her." to notify MD.

## 2011-09-02 NOTE — ED Notes (Signed)
Patient transported to CT 

## 2011-09-02 NOTE — ED Notes (Addendum)
Pt. In yellow fall bracelet and red socks

## 2011-09-02 NOTE — ED Notes (Signed)
Pt. Reminded for urinalysis. 

## 2011-09-02 NOTE — ED Provider Notes (Signed)
History     CSN: [id number]  Arrival date & time 09/02/11  07/22/2049   First MD Initiated Contact with Patient 09/02/11 07/22/01      Chief Complaint  Patient presents with  . Flank Pain    (Consider location/radiation/quality/duration/timing/severity/associated sxs/prior treatment) HPI Comments: Patient is an obese 41 year old female with a history of kidney stones needing lithotripsy x2 presents emergency department with chief complaint of left flank pain.  Onset of pain began acutely today and is located in the left flank area.  Severity is 10/10.  Associated symptoms include nausea and vomiting x1.  Patient denies fever, night sweats, chills, dysuria, anuria, and abdominal pain.    The history is provided by the patient.    Past Medical History  Diagnosis Date  . Hypothyroid   . Anxiety     reactive   . Allergy   . History of nephrolithiasis 11/2008    under urology care uric acid rx ?  . Varicose veins     Past Surgical History  Procedure Date  . Carpal tunnel release   . Rt knee surgery 08/1987  . Foot surgery   . Cholecystectomy   . Lithotripsy     twice by Dr. Patsi Sears  . Teeth extracted     on top for decay    Family History  Problem Relation Age of Onset  . Breast cancer Mother     deceased 07/23/2006 07-23-2007  . ADD / ADHD Son     History  Substance Use Topics  . Smoking status: Never Smoker   . Smokeless tobacco: Not on file  . Alcohol Use: No    OB History    Grav Para Term Preterm Abortions TAB SAB Ect Mult Living                  Review of Systems  Constitutional: Negative for fever, chills, diaphoresis and fatigue.  HENT: Negative for ear pain and sinus pressure.   Eyes: Negative for visual disturbance.  Respiratory: Negative for cough.   Cardiovascular: Negative for chest pain.  Gastrointestinal: Positive for nausea and vomiting. Negative for abdominal pain.  Genitourinary: Positive for dysuria, urgency, flank pain and difficulty urinating.    Skin: Negative for color change and rash.  Neurological: Negative for dizziness and headaches.  Psychiatric/Behavioral: Negative for confusion.  All other systems reviewed and are negative.    Allergies  Nalbuphine and Ondansetron  Home Medications   Current Outpatient Rx  Name Route Sig Dispense Refill  . DULOXETINE HCL 60 MG PO CPEP Oral Take 60 mg by mouth daily.    Marland Kitchen LEVOTHYROXINE SODIUM 112 MCG PO TABS Oral Take 112 mcg by mouth daily.      BP 140/75  Pulse 68  Temp 98.4 F (36.9 C) (Oral)  Resp 18  SpO2 98%  LMP 08/01/2011  Physical Exam  Nursing note and vitals reviewed. Constitutional: She is oriented to person, place, and time. She appears well-developed and well-nourished. She appears distressed.  HENT:  Head: Normocephalic and atraumatic.  Eyes: Conjunctivae and EOM are normal.  Neck: Normal range of motion. Neck supple.  Cardiovascular: Normal rate, regular rhythm and normal heart sounds.   Pulmonary/Chest: Effort normal.  Abdominal: Soft. Normal appearance and bowel sounds are normal. She exhibits no distension, no ascites and no pulsatile midline mass. There is CVA tenderness.  Musculoskeletal: Normal range of motion.  Neurological: She is alert and oriented to person, place, and time.  Skin: Skin is warm and dry.  She is not diaphoretic.  Psychiatric: She has a normal mood and affect. Her behavior is normal.    ED Course  Procedures (including critical care time)  Labs Reviewed  URINALYSIS, ROUTINE W REFLEX MICROSCOPIC - Abnormal; Notable for the following:    APPearance CLOUDY (*)     Hgb urine dipstick LARGE (*)     Protein, ur 30 (*)     Nitrite POSITIVE (*)     Leukocytes, UA LARGE (*)     All other components within normal limits  URINE MICROSCOPIC-ADD ON - Abnormal; Notable for the following:    Bacteria, UA MANY (*)     Crystals CA OXALATE CRYSTALS (*)     All other components within normal limits  POCT PREGNANCY, URINE   Ct Abdomen  Pelvis Wo Contrast  09/02/2011  *RADIOLOGY REPORT*  Clinical Data: Left flank pain  CT ABDOMEN AND PELVIS WITHOUT CONTRAST  Technique:  Multidetector CT imaging of the abdomen and pelvis was performed following the standard protocol without intravenous contrast.  Comparison: 12/15/2007  Findings: Lung bases clear.  Normal heart size.  No pericardial or pleural effusion.  Negative for hiatal hernia.  Abdomen:  Prior cholecystectomy evident.  Liver, biliary system, pancreas, spleen, accessory splenule, and adrenal glands are within normal limits for noncontrast study.  Left kidney demonstrates acute moderate hydronephrosis and hydroureter with surrounding inflammatory stranding.  This extends into the pelvis where there is an obstructing left distal ureteral calculus measuring 5.5 mm, image 88.  This is just superior to the bladder.  No other left urinary tract calculi.  Right kidney demonstrates no acute process but has a punctate nonobstructing calculus in the upper pole, image 26.  Left ureter is nondilated.  No abdominal free fluid, fluid collection, hemorrhage, adenopathy, or abscess  Negative for bowel obstruction, dilatation, ileus, or free air. Moderate retained stool throughout the colon.  Fat containing umbilical hernia noted.  Pelvis:  No pelvic free fluid, fluid collection, hemorrhage, adenopathy, abscess, or inguinal abnormality.  Uterus is normal in size.  No adnexal abnormality appreciated.  Diffuse degenerative changes of the spine  IMPRESSION: 5.5 mm left distal ureteral moderately obstructing calculus with left hydronephrosis and hydroureter.  Punctate upper pole nonobstructing right nephrolithiasis  Fat containing umbilical hernia  Original Report Authenticated By: Judie Petit. Ruel Favors, M.D.     No diagnosis found.    MDM  Kidney stone complicated by UTI, afebrile  Pt has been diagnosed with a Kidney Stone via CT. Consult to urology: place pt on abx & dc w flomax + pain meds. Strict return  precautions discussed including developing a fever. Pt agreeable with plan          Jaci Carrel, PA-C 09/03/11 0002

## 2011-09-02 NOTE — ED Notes (Signed)
Pt. Presented to Ed with complaint of flank pain at 10/10. Pt. Claimed that she had kidney stones several times. Pt. Also reported N/V.

## 2011-09-03 MED ORDER — OXYCODONE-ACETAMINOPHEN 5-325 MG PO TABS: 1.0000 | ORAL_TABLET | Freq: Four times a day (QID) | ORAL | Status: AC | PRN

## 2011-09-03 MED ORDER — ONDANSETRON 8 MG PO TBDP: 8.0000 mg | ORAL_TABLET | Freq: Once | ORAL | Status: DC

## 2011-09-03 MED ORDER — PROMETHAZINE HCL 25 MG PO TABS
25.0000 mg | ORAL_TABLET | Freq: Once | ORAL | Status: AC
  Administered 2011-09-03: 25 mg via ORAL
  Filled 2011-09-03: qty 1

## 2011-09-03 MED ORDER — TAMSULOSIN HCL 0.4 MG PO CAPS: 0.4000 mg | ORAL_CAPSULE | Freq: Every day | ORAL | Status: DC

## 2011-09-03 MED ORDER — CEPHALEXIN 500 MG PO CAPS: 500.0000 mg | ORAL_CAPSULE | Freq: Four times a day (QID) | ORAL | Status: AC

## 2011-09-03 NOTE — ED Notes (Signed)
Pt. Vomited twice  upon discharged  at check out window . Pt. Ushered back to room and placed in bed. To notify MD. Kept monitored.

## 2011-09-03 NOTE — ED Provider Notes (Signed)
Medical screening examination/treatment/procedure(s) were performed by non-physician practitioner and as supervising physician I was immediately available for consultation/collaboration.  Derwood Kaplan, MD 09/03/11 [phone]

## 2011-09-05 LAB — URINE CULTURE: Colony Count: >=100000

## 2011-09-06 NOTE — ED Notes (Signed)
+   urine Patient treated with Keflex-sensitive to same-chart appended per protocol MD. 

## 2011-09-20 ENCOUNTER — Telehealth: Payer: Self-pay | Admitting: Internal Medicine

## 2011-09-20 NOTE — Telephone Encounter (Signed)
Patient came in today requesting medical records for her son, but also wanted to set up her physical.  Physicals are pushed back to October and she knows that you have been calling to get her in ASAP due to her ER follow up.  Can you please advise and call patient?  Thank you

## 2011-09-21 NOTE — Telephone Encounter (Signed)
How about  130 and 145 slots on Monday September 9th  Also is she seeing urologist for the renal stone follow up?  (I see that  There was and appt in July that was recorded as No show  )

## 2011-09-21 NOTE — Telephone Encounter (Signed)
Would you like to squeeze in somewhere?

## 2011-09-22 NOTE — Telephone Encounter (Signed)
Tried reaching the patient.  Received a message that her mailbox was full.  Unable to leave a message.  Will try again at a later time.

## 2011-09-23 NOTE — Telephone Encounter (Signed)
Patient is coming on the 10/04/11 @ 1:15.  Notified by telephone.

## 2011-10-04 ENCOUNTER — Ambulatory Visit (INDEPENDENT_AMBULATORY_CARE_PROVIDER_SITE_OTHER): Payer: BC Managed Care – PPO | Admitting: Internal Medicine

## 2011-10-04 ENCOUNTER — Encounter: Payer: Self-pay | Admitting: Internal Medicine

## 2011-10-04 VITALS — BP 112/80 | HR 79 | Temp 98.8°F | Wt 298.0 lb

## 2011-10-04 DIAGNOSIS — N139 Obstructive and reflux uropathy, unspecified: Secondary | ICD-10-CM

## 2011-10-04 DIAGNOSIS — N318 Other neuromuscular dysfunction of bladder: Secondary | ICD-10-CM

## 2011-10-04 DIAGNOSIS — N200 Calculus of kidney: Secondary | ICD-10-CM

## 2011-10-04 DIAGNOSIS — N3281 Overactive bladder: Secondary | ICD-10-CM

## 2011-10-04 DIAGNOSIS — N390 Urinary tract infection, site not specified: Secondary | ICD-10-CM

## 2011-10-04 DIAGNOSIS — N138 Other obstructive and reflux uropathy: Secondary | ICD-10-CM

## 2011-10-04 DIAGNOSIS — Z87442 Personal history of urinary calculi: Secondary | ICD-10-CM

## 2011-10-04 LAB — POCT URINALYSIS DIPSTICK
Bilirubin, UA: NEGATIVE
Blood, UA: NEGATIVE
Ketones, UA: NEGATIVE
Leukocytes, UA: NEGATIVE
Spec Grav, UA: 1.015
pH, UA: 7

## 2011-10-04 NOTE — Patient Instructions (Addendum)
Urine infection appears to be   Because there was a stone that was somewhat obstructing YOu should see your urologist to ensure no kidney damage over time.   Also they can address the Urinary dribbling .  And frequency.

## 2011-10-04 NOTE — Progress Notes (Signed)
  Subjective:    Patient ID: [name], female    DOB: [birth year], 41 y.o.   MRN: [id number]  HPI Patient comes in today for followup of emergency room evaluation in early August. At that time she presented with acute left-sided abdominal pain diagnosed with a 5 mm obstructing stone in the distal ureter on the left. She was also noted to have a UTI Escherichia coli pansensitive.  Given toradol and dilauded in the ed. Flomax   im rocephin ,oral.antibiotic  That caused  Nausea and so stopped.   ( keflex qid ) probably had about 5 days of antibiotic  Pain resolved after one night  No percocet after this.   And never got bad .    Never went to the urologist.  Because she had to move out of her house to an apartment and was feeling well. Dr Patsi Sears is her urologist she has a history of 2 other kidney stones and history of lithotripsy. She thinks her last check there was  Poss last year.  Moved out of house  For family reasons.    Moved to apt complex.   Review of Systems Had a migraine on caffiene wd. No chest pain shortness of breath her anxiety is much better having moved is trying to lose weight no hematuria current flank pain. She does have urinary frequency and dribbling and urgency that is not stress-induced or stress incontinence. She asked about help for that.  Past history family history social history reviewed in the electronic medical record. Outpatient Encounter Prescriptions as of 10/04/2011  Medication Sig Dispense Refill  . DULoxetine (CYMBALTA) 60 MG capsule Take 60 mg by mouth daily.      Marland Kitchen levothyroxine (SYNTHROID, LEVOTHROID) 112 MCG tablet Take 112 mcg by mouth daily.      . Tamsulosin HCl (FLOMAX) 0.4 MG CAPS Take 1 capsule (0.4 mg total) by mouth at bedtime.  5 capsule  0        Objective:   Physical Exam BP 112/80  Pulse 79  Temp 98.8 F (37.1 C) (Oral)  Wt 298 lb (135.172 kg)  SpO2 98%  LMP 08/26/2011 WDWN in nad  Looks well today   Abdomen:  Sof,t normal  bowel sounds without hepatosplenomegaly, no guarding rebound or masses no CVA tenderness Oriented x 3. Normal cognition, attention, speech. Not anxious or depressed appearing   Good eye contact . Ed notes reviewed Ua clear     Assessment & Plan:   S/P ED visit early August for   Obstructing left 5.5 mm stone  With e coli UTI pansensitive.  Only took 4 day of med or so cause of se and no fu as was busy moving and felt well. Infection appears to be cleared . Has seen Dr Patsi Sears in past   May need re imaging or such  Although asymptomatic today  Because of obstruction on x ray.  Never went to   Urologist cause of life issues   Good now.   Urinary  Leaking  Not always stress ui  Related .    Asks advice  Will re efer back  to urologist .   For both of above.

## 2011-10-06 ENCOUNTER — Encounter: Payer: Self-pay | Admitting: Internal Medicine

## 2011-10-06 ENCOUNTER — Other Ambulatory Visit: Payer: BC Managed Care – PPO

## 2011-10-06 LAB — URINE CULTURE: Colony Count: 75000

## 2011-10-08 ENCOUNTER — Encounter: Payer: Self-pay | Admitting: Internal Medicine

## 2011-10-11 ENCOUNTER — Ambulatory Visit (INDEPENDENT_AMBULATORY_CARE_PROVIDER_SITE_OTHER): Payer: BC Managed Care – PPO | Admitting: Family Medicine

## 2011-10-11 DIAGNOSIS — Z0000 Encounter for general adult medical examination without abnormal findings: Secondary | ICD-10-CM

## 2011-10-13 ENCOUNTER — Ambulatory Visit (INDEPENDENT_AMBULATORY_CARE_PROVIDER_SITE_OTHER): Payer: BC Managed Care – PPO | Admitting: Family Medicine

## 2011-10-13 DIAGNOSIS — Z23 Encounter for immunization: Secondary | ICD-10-CM

## 2011-12-24 ENCOUNTER — Other Ambulatory Visit: Payer: Self-pay | Admitting: Internal Medicine

## 2011-12-30 ENCOUNTER — Telehealth: Payer: Self-pay | Admitting: Family Medicine

## 2011-12-30 NOTE — Telephone Encounter (Signed)
Pt is calling to request refills.  Last seen on 10/04/11.  Last filled on 09/02/11.  Please advise.  Thanks!

## 2011-12-31 MED ORDER — LORAZEPAM 0.5 MG PO TABS: 0.5000 mg | ORAL_TABLET | Freq: Three times a day (TID) | ORAL | Status: DC | PRN

## 2011-12-31 NOTE — Telephone Encounter (Signed)
Ok x 1

## 2011-12-31 NOTE — Telephone Encounter (Signed)
Called to the pharmacy and left on voicemail. 

## 2012-04-12 ENCOUNTER — Telehealth: Payer: Self-pay | Admitting: Internal Medicine

## 2012-04-12 ENCOUNTER — Other Ambulatory Visit: Payer: Self-pay | Admitting: Family Medicine

## 2012-04-12 MED ORDER — DULOXETINE HCL 60 MG PO CPEP: 60.0000 mg | ORAL_CAPSULE | Freq: Every day | ORAL | Status: DC

## 2012-04-12 NOTE — Telephone Encounter (Signed)
Medication sent by e-scribe.

## 2012-04-12 NOTE — Telephone Encounter (Signed)
Pt walked in to office and requested samples of Cymbalta 60 mg.  I spoke with patient and advised her Cymbalta is now available in a generic and we no longer receive samples for them, offered patient a refill of the generic, she accepted.  Please send rx for generic Cymbalta 60mg  to [facility] on [address]

## 2012-04-17 DIAGNOSIS — Z0279 Encounter for issue of other medical certificate: Secondary | ICD-10-CM

## 2012-04-26 ENCOUNTER — Ambulatory Visit (INDEPENDENT_AMBULATORY_CARE_PROVIDER_SITE_OTHER): Payer: Self-pay | Admitting: Internal Medicine

## 2012-04-26 ENCOUNTER — Encounter: Payer: Self-pay | Admitting: Internal Medicine

## 2012-04-26 VITALS — BP 124/82 | HR 72 | Temp 98.6°F | Wt 314.0 lb

## 2012-04-26 DIAGNOSIS — M549 Dorsalgia, unspecified: Secondary | ICD-10-CM | POA: Insufficient documentation

## 2012-04-26 MED ORDER — PREDNISONE 10 MG PO TABS: ORAL_TABLET | ORAL | Status: DC

## 2012-04-26 MED ORDER — METHOCARBAMOL 500 MG PO TABS: 500.0000 mg | ORAL_TABLET | Freq: Three times a day (TID) | ORAL | Status: DC | PRN
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
## 2012-04-26 NOTE — Patient Instructions (Addendum)
Follow up with Dr. Fabian Sharp within 2 weeks.

## 2012-04-26 NOTE — Assessment & Plan Note (Addendum)
42 year old white female presents with thoracic and lumbar pain. I suspect lumbar pain is discogenic in nature. Treat with prednisone taper and Robaxin. She failed outpatient over-the-counter NSAIDs. Thoracic pain this reactive/somatic dysfunction. Utilized myofascial release techniques and used HVLA to correct somatic dysfunction T 8- 9.  Patient tolerated well.  Patient advised to avoid heavy lifting for next 2 weeks.

## 2012-07-21 ENCOUNTER — Other Ambulatory Visit: Payer: Self-pay | Admitting: Internal Medicine

## 2012-07-24 NOTE — Telephone Encounter (Signed)
Tell pt she is due for lab work   . tsh bmp      Can refill  Thyroid med for 1 months  To make sure doesn't run out.  Get labs and then ROV for review in the meantime.

## 2012-07-24 NOTE — Telephone Encounter (Signed)
Last TSH was 07/27/11.  Should she come in?

## 2012-09-04 ENCOUNTER — Other Ambulatory Visit: Payer: Self-pay | Admitting: Family Medicine

## 2012-09-04 DIAGNOSIS — Z0000 Encounter for general adult medical examination without abnormal findings: Secondary | ICD-10-CM

## 2012-09-04 MED ORDER — LEVOTHYROXINE SODIUM 112 MCG PO TABS: ORAL_TABLET | ORAL | Status: DC

## 2012-09-04 NOTE — Telephone Encounter (Signed)
Patient was last seen on 10/04/11.  Last TSH was 07/27/11.  Filled levothyroxine for 30 days so pt would not run out of meds while PCP on vacation.  Please schedule lab work and yearly appt.  Thanks!!

## 2012-10-06 ENCOUNTER — Other Ambulatory Visit: Payer: Self-pay | Admitting: Internal Medicine

## 2012-10-09 ENCOUNTER — Telehealth: Payer: Self-pay | Admitting: Internal Medicine

## 2012-10-09 MED ORDER — LEVOTHYROXINE SODIUM 112 MCG PO TABS: ORAL_TABLET | ORAL | Status: DC

## 2012-10-09 NOTE — Telephone Encounter (Signed)
Patient's levothyroxine refill was denied this last time, as she is due for yearly appt and labs. I have sched the labs for 9/17, cpx for 9/29. She has 1 levothyroxine left, for tomorrow. Can she get two weeks' worth to get her to the 9/29 cpx?  [facility] on [address]. Thank you.

## 2012-10-09 NOTE — Telephone Encounter (Signed)
Patient notified by telephone. 

## 2012-10-09 NOTE — Telephone Encounter (Signed)
#  30 sent to the [facility] on [address].

## 2012-10-11 ENCOUNTER — Other Ambulatory Visit (INDEPENDENT_AMBULATORY_CARE_PROVIDER_SITE_OTHER): Payer: Medicaid Other

## 2012-10-11 DIAGNOSIS — Z0000 Encounter for general adult medical examination without abnormal findings: Secondary | ICD-10-CM

## 2012-10-11 LAB — CBC WITH DIFFERENTIAL/PLATELET
Basophils Relative: 0.7 % (ref 0.0–3.0)
Eosinophils Absolute: 0.2 10*3/uL (ref 0.0–0.7)
Hemoglobin: 13.9 g/dL (ref 12.0–15.0)
Lymphocytes Relative: 27.5 % (ref 12.0–46.0)
MCHC: 34.1 g/dL (ref 30.0–36.0)
Monocytes Relative: 5.8 % (ref 3.0–12.0)
Neutro Abs: 3.8 10*3/uL (ref 1.4–7.7)
RBC: 4.5 Mil/uL (ref 3.87–5.11)

## 2012-10-11 LAB — BASIC METABOLIC PANEL
Chloride: 105 mEq/L (ref 96–112)
GFR: 65.9 mL/min (ref >60.00)
Glucose, Bld: 112 mg/dL — ABNORMAL HIGH (ref 70–99)
Potassium: 3.8 mEq/L (ref 3.5–5.1)
Sodium: 136 mEq/L (ref 135–145)

## 2012-10-11 LAB — HEPATIC FUNCTION PANEL
AST: 14 U/L (ref 0–37)
Albumin: 3.7 g/dL (ref 3.5–5.2)
Alkaline Phosphatase: 79 U/L (ref 39–117)
Total Protein: 7.3 g/dL (ref 6.0–8.3)

## 2012-10-23 ENCOUNTER — Other Ambulatory Visit (HOSPITAL_COMMUNITY)
Admission: RE | Admit: 2012-10-23 | Discharge: 2012-10-23 | Disposition: A | Discharge status: Home / Self Care | Payer: Medicaid Other | Source: Ambulatory Visit | Attending: Internal Medicine | Admitting: Internal Medicine

## 2012-10-23 ENCOUNTER — Encounter: Payer: Self-pay | Admitting: Internal Medicine

## 2012-10-23 ENCOUNTER — Ambulatory Visit (INDEPENDENT_AMBULATORY_CARE_PROVIDER_SITE_OTHER): Payer: Medicaid Other | Admitting: Internal Medicine

## 2012-10-23 VITALS — BP 124/60 | HR 71 | Temp 98.1°F | Ht 69.25 in | Wt 326.0 lb

## 2012-10-23 DIAGNOSIS — Z0000 Encounter for general adult medical examination without abnormal findings: Secondary | ICD-10-CM | POA: Insufficient documentation

## 2012-10-23 DIAGNOSIS — M25649 Stiffness of unspecified hand, not elsewhere classified: Secondary | ICD-10-CM | POA: Insufficient documentation

## 2012-10-23 DIAGNOSIS — E785 Hyperlipidemia, unspecified: Secondary | ICD-10-CM

## 2012-10-23 DIAGNOSIS — Z1151 Encounter for screening for human papillomavirus (HPV): Secondary | ICD-10-CM | POA: Insufficient documentation

## 2012-10-23 DIAGNOSIS — F4322 Adjustment disorder with anxiety: Secondary | ICD-10-CM

## 2012-10-23 DIAGNOSIS — K429 Umbilical hernia without obstruction or gangrene: Secondary | ICD-10-CM

## 2012-10-23 DIAGNOSIS — R7309 Other abnormal glucose: Secondary | ICD-10-CM

## 2012-10-23 DIAGNOSIS — E039 Hypothyroidism, unspecified: Secondary | ICD-10-CM

## 2012-10-23 DIAGNOSIS — Z01419 Encounter for gynecological examination (general) (routine) without abnormal findings: Secondary | ICD-10-CM

## 2012-10-23 DIAGNOSIS — Z803 Family history of malignant neoplasm of breast: Secondary | ICD-10-CM

## 2012-10-23 DIAGNOSIS — Z23 Encounter for immunization: Secondary | ICD-10-CM

## 2012-10-23 MED ORDER — LORAZEPAM 0.5 MG PO TABS: 0.5000 mg | ORAL_TABLET | Freq: Three times a day (TID) | ORAL | Status: DC | PRN

## 2012-10-23 MED ORDER — LEVOTHYROXINE SODIUM 112 MCG PO TABS: ORAL_TABLET | ORAL | Status: DC

## 2012-10-23 NOTE — Progress Notes (Signed)
Chief Complaint  Patient presents with  . Annual Exam  . Hypothyroidism    HPI: Patient comes in today for Preventive Health Care visit  Last visit was 9 13 for renal stone sx .   Hx of bladder leakage.      Gyne advised   Evaluation. For leakage. It doesn't feel like she will go for this  No recent renal stones.   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 Last refill of 30 was December 2013 would like some on hand  Periods monthly bleeds 3 . Days .  Concern about joint pains it is not hernia in her shoulders but hand pains even to touch with stiffness and swelling in the mornings over the last month. Family history of osteoarthritis as well as her personal history of arthritis but this is new and worse.  Has an umbilical hernia that is somewhat bigger pouching out not particularly tender unless she touches it.  Had an episode of acute low back pain and gained some weight at that time.  ROS:  GEN/ HEENT: No fever, significant weight changes sweats headaches vision problems hearing changes, CV/ PULM; No chest pain shortness of breath cough, syncope,edema  change in exercise tolerance. GI /GU: No adominal pain, vomiting, change in bowel habits. No blood in the stool. No significant GU symptoms. SKIN/HEME: ,no acute skin rashes suspicious lesions or bleeding. No lymphadenopathy, nodules, masses.  NEURO/ PSYCH:  No neurologic signs such as weakness numbness.  IMM/ Allergy: No unusual infections.  Allergy .   REST of 12 system review negative except as per HPI   Past Medical History  Diagnosis Date  . Hypothyroid   . Anxiety     reactive   . Allergy   . History of  nephrolithiasis 11/2008    under urology care uric acid rx ?  . Varicose veins     Family History  Problem Relation Age of Onset  . Breast cancer Mother     deceased 06-13-2006 06/13/2007  . ADD / ADHD Son   . Osteoarthritis Mother     History   Social History  . Marital Status: Married    Spouse Name: N/A    Number of Children: N/A  . Years of Education: N/A   Social History Main Topics  . Smoking status: Never Smoker   . Smokeless tobacco: None  . Alcohol Use: No  . Drug Use: No  . Sexual Activity: None   Other Topics Concern  . None   Social History Narrative   Divorced .    Neg etoh tea.    Independent   Condo doing better now   H H   2 son     working Chief Operating Officer in [employer] residents .   Childbirth x 1   Mom and child in counseling doing well with support  Counseling     Father with  Hx os anger and other dx .   Divorce  June    Has BF supportive  and lives with son .  Outpatient Encounter Prescriptions as of 10/23/2012  Medication Sig Dispense Refill  . cetirizine (ZYRTEC) 10 MG tablet Take 10 mg by mouth daily.      . DULoxetine (CYMBALTA) 60 MG capsule TAKE ONE CAPSULE BY MOUTH ONCE DAILY  30 capsule  0  . levothyroxine (SYNTHROID, LEVOTHROID) 112 MCG tablet TAKE ONE TABLET BY MOUTH ONCE DAILY  90 tablet  3  . LORazepam (ATIVAN) 0.5 MG tablet Take 1 tablet (0.5 mg total) by mouth every 8 (eight) hours as needed for anxiety.  30 tablet  0  . methocarbamol (ROBAXIN) 500 MG tablet Take 1 tablet (500 mg total) by mouth 3 (three) times daily as needed.  30 tablet  0  . [DISCONTINUED] levothyroxine (SYNTHROID, LEVOTHROID) 112 MCG tablet TAKE ONE TABLET BY MOUTH ONCE DAILY  30 tablet  0  . [DISCONTINUED] LORazepam (ATIVAN) 0.5 MG tablet Take 1 tablet (0.5 mg total) by mouth every 8 (eight) hours as needed for anxiety.  30 tablet  0  . [DISCONTINUED] predniSONE (DELTASONE) 10 MG tablet 3 tabs once daily for 3 days, then  2 tabs once daily for 3 days, then 1 tab daily for 3 days  18 tablet  0   No facility-administered encounter medications on file as of 10/23/2012.    EXAM:  BP 124/60  Pulse 71  Temp(Src) 98.1 F (36.7 C) (Oral)  Ht 5' 9.25" (1.759 m)  Wt 326 lb (147.873 kg)  BMI 47.79 kg/m2  SpO2 98%  Body mass index is 47.79 kg/(m^2).  Physical Exam: Vital signs reviewed [id number] is a well-developed well-nourished alert cooperative   female who appears her stated age in no acute distress.  HEENT: normocephalic atraumatic , Eyes: PERRL EOM's full, conjunctiva clear, Nares: paten,t no deformity discharge or tenderness., Ears: no deformity EAC's clear TMs with normal landmarks. Mouth: clear OP, no lesions, edema.  Moist mucous membranes. Dentition in adequate repair. NECK: supple without masses, thyromegaly or bruits. CHEST/PULM:  Clear to auscultation and percussion breath sounds equal no wheeze , rales or rhonchi. No chest wall deformities or tenderness. CV: PMI is nondisplaced, S1 S2 no gallops, murmurs, rubs. Peripheral pulses are full without delay.No JVD .  Breast: normal by inspection . No dimpling, discharge, masses, tenderness or discharge . ABDOMEN: Bowel sounds normal nontender  No guard or rebound, no hepato splenomegal no CVA tenderness. 3 cm  umbi hernia reducible non tender  Extremtities:  No clubbing cyanosis or edema, no acute joint swelling or redness no focal atrophy stiffness hands ? oa changes or other  NEURO:  Oriented x3, cranial nerves 3-12 appear to be intact, no obvious focal weakness,gait within normal limits no abnormal reflexes or asymmetrical SKIN: No acute rashes normal turgor, color, no bruising or petechiae. PSYCH: Oriented, good eye contact, no obvious depression anxiety, cognition and judgment appear normal. LN: no cervical axillary inguinal adenopathy Pelvic: NL ext GU, labia clear without lesions or rash . Vagina no lesions .Cervix: clear  UTERUS: Neg CMT Adnexa:   clear no masses . Exam limited by large abdominal wall PAP done hpv ordered rectal no masses  Lab Results  Component Value Date   WBC 6.0 10/11/2012   HGB 13.9 10/11/2012   HCT 40.6 10/11/2012   PLT 206.0 10/11/2012   GLUCOSE 112* 10/11/2012   CHOL 172 10/11/2012   TRIG 62.0 10/11/2012   HDL 37.60* 10/11/2012   LDLDIRECT 143.2 06/06/2009   LDLCALC 122* 10/11/2012   ALT 16 10/11/2012   AST 14 10/11/2012  NA 136 10/11/2012   K 3.8 10/11/2012   CL 105 10/11/2012   CREATININE 1.0 10/11/2012   BUN 10 10/11/2012   CO2 27 10/11/2012   TSH 1.52 10/11/2012   HGBA1C 5.6 11/06/2010    ASSESSMENT AND PLAN:  Discussed the following assessment and plan:  Visit for preventive health examination - Plan: PAP [[place]]  HYPOTHYROIDISM - same dose  HYPERLIPIDEMIA - low hdl intensify lsi   HYPERGLYCEMIA pre diabetes - Advised she stop all her sweet beverages including her sweet tea no sugars told her it was alarming she is at high risk for diabetes personal history of gestati  Need for prophylactic vaccination and inoculation against influenza - Plan: Flu Vaccine QUAD 36+ mos PF IM (Fluarix)  Umbilical hernia - Slightly larger occasional tender referral to surgeon; advised lose weight - Plan: Ambulatory referral to General Surgery  Joint stiffness of hand, unspecified laterality - bilateral and pain in am  has prob OA but get opinoin aobut poss inflammatory disease - Plan: Ambulatory referral to Rheumatology  Family hx-breast malignancy  Encounter for routine gynecological examination - Plan: PAP [[place]]  Adjustment disorder with anxiety - Much improved on Cymbalta, and frequent use of rescue benzos Counseled regarding healthy nutrition, exercise, sleep, injury prevention, calcium vit d and healthy weight .  Patient Care Team: Madelin Headings, MD as PCP - General Kathi Ludwig, MD (Urology) Patient Instructions   YOu are still pre diabetic  Weight loss in a healthy manner will  help back and avoid getting diabetes which you are a t risk to get.  No sugar drinks   Cut down on all sugars sweets and simple carbs such as whit bread rice potatoes etc.  Read labels  Most processed foods have sugars fructose sucrose etc that can raise your blood sugar and help you gain weight Move more as possible to hwlp you back eventulally.  You will be contacted about referral for rheum eval for the hand pain and stiffness .  Also  surgery opinioin about the umbilical hernia   Preventive Care for Adults, Female A healthy lifestyle and preventive care can promote health and wellness. Preventive health guidelines for women include the following key practices.  A routine yearly physical is a good way to check with your caregiver about your health and preventive screening. It is a chance to share any concerns and updates on your health, and to receive a thorough exam.  Visit your dentist for a routine exam and preventive care every 6 months. Brush your teeth twice a day and floss once a day. Good oral hygiene prevents tooth decay and gum disease.  The frequency of eye exams is based on your age, health, family medical history, use of contact lenses, and other factors. Follow your caregiver's recommendations for frequency of eye exams.  Eat a healthy diet. Foods like vegetables, fruits, whole grains, low-fat dairy products, and lean protein foods contain the nutrients you need without too many calories. Decrease your intake of foods high in solid fats, added sugars, and salt. Eat the right amount of calories for you.Get information about a proper diet from your caregiver, if necessary.  Regular physical exercise is one of the most important things you can do for your health. Most adults should get at least 150 minutes of moderate-intensity exercise (any activity that increases your heart rate and causes you to sweat) each week. In addition, most adults need muscle-strengthening exercises on 2  or more days a  week.  Maintain a healthy weight. The body mass index (BMI) is a screening tool to identify possible weight problems. It provides an estimate of body fat based on height and weight. Your caregiver can help determine your BMI, and can help you achieve or maintain a healthy weight.For adults 20 years and older:  A BMI below 18.5 is considered underweight.  A BMI of 18.5 to 24.9 is normal.  A BMI of 25 to 29.9 is considered overweight.  A BMI of 30 and above is considered obese.  Maintain normal blood lipids and cholesterol levels by exercising and minimizing your intake of saturated fat. Eat a balanced diet with plenty of fruit and vegetables. Blood tests for lipids and cholesterol should begin at age 50 and be repeated every 5 years. If your lipid or cholesterol levels are high, you are over 50, or you are at high risk for heart disease, you may need your cholesterol levels checked more frequently.Ongoing high lipid and cholesterol levels should be treated with medicines if diet and exercise are not effective.  If you smoke, find out from your caregiver how to quit. If you do not use tobacco, do not start.  If you are pregnant, do not drink alcohol. If you are breastfeeding, be very cautious about drinking alcohol. If you are not pregnant and choose to drink alcohol, do not exceed 1 drink per day. One drink is considered to be 12 ounces (355 mL) of beer, 5 ounces (148 mL) of wine, or 1.5 ounces (44 mL) of liquor.  Avoid use of street drugs. Do not share needles with anyone. Ask for help if you need support or instructions about stopping the use of drugs.  High blood pressure causes heart disease and increases the risk of stroke. Your blood pressure should be checked at least every 1 to 2 years. Ongoing high blood pressure should be treated with medicines if weight loss and exercise are not effective.  If you are 8 to 43 years old, ask your caregiver if you should take aspirin  to prevent strokes.  Diabetes screening involves taking a blood sample to check your fasting blood sugar level. This should be done once every 3 years, after age 77, if you are within normal weight and without risk factors for diabetes. Testing should be considered at a younger age or be carried out more frequently if you are overweight and have at least 1 risk factor for diabetes.  Breast cancer screening is essential preventive care for women. You should practice "breast self-awareness." This means understanding the normal appearance and feel of your breasts and may include breast self-examination. Any changes detected, no matter how small, should be reported to a caregiver. Women in their 16s and 30s should have a clinical breast exam (CBE) by a caregiver as part of a regular health exam every 1 to 3 years. After age 40, women should have a CBE every year. Starting at age 16, women should consider having a mammography (breast X-ray test) every year. Women who have a family history of breast cancer should talk to their caregiver about genetic screening. Women at a high risk of breast cancer should talk to their caregivers about having magnetic resonance imaging (MRI) and a mammography every year.  The Pap test is a screening test for cervical cancer. A Pap test can show cell changes on the cervix that might become cervical cancer if left untreated. A Pap test is a procedure in which cells are obtained and examined  from the lower end of the uterus (cervix).  Women should have a Pap test starting at age 37.  Between ages 52 and 64, Pap tests should be repeated every 2 years.  Beginning at age 54, you should have a Pap test every 3 years as long as the past 3 Pap tests have been normal.  Some women have medical problems that increase the chance of getting cervical cancer. Talk to your caregiver about these problems. It is especially important to talk to your caregiver if a new problem develops soon  after your last Pap test. In these cases, your caregiver may recommend more frequent screening and Pap tests.  The above recommendations are the same for women who have or have not gotten the vaccine for human papillomavirus (HPV).  If you had a hysterectomy for a problem that was not cancer or a condition that could lead to cancer, then you no longer need Pap tests. Even if you no longer need a Pap test, a regular exam is a good idea to make sure no other problems are starting.  If you are between ages 49 and 69, and you have had normal Pap tests going back 10 years, you no longer need Pap tests. Even if you no longer need a Pap test, a regular exam is a good idea to make sure no other problems are starting.  If you have had past treatment for cervical cancer or a condition that could lead to cancer, you need Pap tests and screening for cancer for at least 20 years after your treatment.  If Pap tests have been discontinued, risk factors (such as a new sexual partner) need to be reassessed to determine if screening should be resumed.  The HPV test is an additional test that may be used for cervical cancer screening. The HPV test looks for the virus that can cause the cell changes on the cervix. The cells collected during the Pap test can be tested for HPV. The HPV test could be used to screen women aged 41 years and older, and should be used in women of any age who have unclear Pap test results. After the age of 73, women should have HPV testing at the same frequency as a Pap test.  Colorectal cancer can be detected and often prevented. Most routine colorectal cancer screening begins at the age of 41 and continues through age 27. However, your caregiver may recommend screening at an earlier age if you have risk factors for colon cancer. On a yearly basis, your caregiver may provide home test kits to check for hidden blood in the stool. Use of a small camera at the end of a tube, to directly examine the  colon (sigmoidoscopy or colonoscopy), can detect the earliest forms of colorectal cancer. Talk to your caregiver about this at age 67, when routine screening begins. Direct examination of the colon should be repeated every 5 to 10 years through age 19, unless early forms of pre-cancerous polyps or small growths are found.  Hepatitis C blood testing is recommended for all people born from [birth year] through 1965 and any individual with known risks for hepatitis C.  Practice safe sex. Use condoms and avoid high-risk sexual practices to reduce the spread of sexually transmitted infections (STIs). STIs include gonorrhea, chlamydia, syphilis, trichomonas, herpes, HPV, and human immunodeficiency virus (HIV). Herpes, HIV, and HPV are viral illnesses that have no cure. They can result in disability, cancer, and death. Sexually active women aged 10 and younger  should be checked for chlamydia. Older women with new or multiple partners should also be tested for chlamydia. Testing for other STIs is recommended if you are sexually active and at increased risk.  Osteoporosis is a disease in which the bones lose minerals and strength with aging. This can result in serious bone fractures. The risk of osteoporosis can be identified using a bone density scan. Women ages 41 and over and women at risk for fractures or osteoporosis should discuss screening with their caregivers. Ask your caregiver whether you should take a calcium supplement or vitamin D to reduce the rate of osteoporosis.  Menopause can be associated with physical symptoms and risks. Hormone replacement therapy is available to decrease symptoms and risks. You should talk to your caregiver about whether hormone replacement therapy is right for you.  Use sunscreen with sun protection factor (SPF) of 30 or more. Apply sunscreen liberally and repeatedly throughout the day. You should seek shade when your shadow is shorter than you. Protect yourself by wearing long  sleeves, pants, a wide-brimmed hat, and sunglasses year round, whenever you are outdoors.  Once a month, do a whole body skin exam, using a mirror to look at the skin on your back. Notify your caregiver of new moles, moles that have irregular borders, moles that are larger than a pencil eraser, or moles that have changed in shape or color.  Stay current with required immunizations.  Influenza. You need a dose every fall (or winter). The composition of the flu vaccine changes each year, so being vaccinated once is not enough.  Pneumococcal polysaccharide. You need 1 to 2 doses if you smoke cigarettes or if you have certain chronic medical conditions. You need 1 dose at age 54 (or older) if you have never been vaccinated.  Tetanus, diphtheria, pertussis (Tdap, Td). Get 1 dose of Tdap vaccine if you are younger than age 33, are over 33 and have contact with an infant, are a Research scientist (physical sciences), are pregnant, or simply want to be protected from whooping cough. After that, you need a Td booster dose every 10 years. Consult your caregiver if you have not had at least 3 tetanus and diphtheria-containing shots sometime in your life or have a deep or dirty wound.  HPV. You need this vaccine if you are a woman age 50 or younger. The vaccine is given in 3 doses over 6 months.  Measles, mumps, rubella (MMR). You need at least 1 dose of MMR if you were born in 1957 or later. You may also need a second dose.  Meningococcal. If you are age 67 to 28 and a first-year college student living in a residence hall, or have one of several medical conditions, you need to get vaccinated against meningococcal disease. You may also need additional booster doses.  Zoster (shingles). If you are age 64 or older, you should get this vaccine.  Varicella (chickenpox). If you have never had chickenpox or you were vaccinated but received only 1 dose, talk to your caregiver to find out if you need this vaccine.  Hepatitis A. You  need this vaccine if you have a specific risk factor for hepatitis A virus infection or you simply wish to be protected from this disease. The vaccine is usually given as 2 doses, 6 to 18 months apart.  Hepatitis B. You need this vaccine if you have a specific risk factor for hepatitis B virus infection or you simply wish to be protected from this disease. The vaccine  is given in 3 doses, usually over 6 months. Preventive Services / Frequency Ages 14 to 36  Blood pressure check.** / Every 1 to 2 years.  Lipid and cholesterol check.** / Every 5 years beginning at age 31.  Clinical breast exam.** / Every 3 years for women in their 74s and 30s.  Pap test.** / Every 2 years from ages 40 through 83. Every 3 years starting at age 94 through age 1 or 40 with a history of 3 consecutive normal Pap tests.  HPV screening.** / Every 3 years from ages 59 through ages 75 to 53 with a history of 3 consecutive normal Pap tests.  Hepatitis C blood test.** / For any individual with known risks for hepatitis C.  Skin self-exam. / Monthly.  Influenza immunization.** / Every year.  Pneumococcal polysaccharide immunization.** / 1 to 2 doses if you smoke cigarettes or if you have certain chronic medical conditions.  Tetanus, diphtheria, pertussis (Tdap, Td) immunization. / A one-time dose of Tdap vaccine. After that, you need a Td booster dose every 10 years.  HPV immunization. / 3 doses over 6 months, if you are 38 and younger.  Measles, mumps, rubella (MMR) immunization. / You need at least 1 dose of MMR if you were born in 1957 or later. You may also need a second dose.  Meningococcal immunization. / 1 dose if you are age 63 to 1 and a first-year college student living in a residence hall, or have one of several medical conditions, you need to get vaccinated against meningococcal disease. You may also need additional booster doses.  Varicella immunization.** / Consult your caregiver.  Hepatitis A  immunization.** / Consult your caregiver. 2 doses, 6 to 18 months apart.  Hepatitis B immunization.** / Consult your caregiver. 3 doses usually over 6 months. Ages 69 to 74  Blood pressure check.** / Every 1 to 2 years.  Lipid and cholesterol check.** / Every 5 years beginning at age 71.  Clinical breast exam.** / Every year after age 71.  Mammogram.** / Every year beginning at age 66 and continuing for as long as you are in good health. Consult with your caregiver.  Pap test.** / Every 3 years starting at age 73 through age 36 or 55 with a history of 3 consecutive normal Pap tests.  HPV screening.** / Every 3 years from ages 29 through ages 52 to 75 with a history of 3 consecutive normal Pap tests.  Fecal occult blood test (FOBT) of stool. / Every year beginning at age 38 and continuing until age 44. You may not need to do this test if you get a colonoscopy every 10 years.  Flexible sigmoidoscopy or colonoscopy.** / Every 5 years for a flexible sigmoidoscopy or every 10 years for a colonoscopy beginning at age 38 and continuing until age 40.  Hepatitis C blood test.** / For all people born from [birth year] through 1966 and any individual with known risks for hepatitis C.  Skin self-exam. / Monthly.  Influenza immunization.** / Every year.  Pneumococcal polysaccharide immunization.** / 1 to 2 doses if you smoke cigarettes or if you have certain chronic medical conditions.  Tetanus, diphtheria, pertussis (Tdap, Td) immunization.** / A one-time dose of Tdap vaccine. After that, you need a Td booster dose every 10 years.  Measles, mumps, rubella (MMR) immunization. / You need at least 1 dose of MMR if you were born in 1957 or later. You may also need a second dose.  Varicella immunization.** /  Consult your caregiver.  Meningococcal immunization.** / Consult your caregiver.  Hepatitis A immunization.** / Consult your caregiver. 2 doses, 6 to 18 months apart.  Hepatitis B immunization.** /  Consult your caregiver. 3 doses, usually over 6 months. Ages 42 and over  Blood pressure check.** / Every 1 to 2 years.  Lipid and cholesterol check.** / Every 5 years beginning at age 11.  Clinical breast exam.** / Every year after age 49.  Mammogram.** / Every year beginning at age 57 and continuing for as long as you are in good health. Consult with your caregiver.  Pap test.** / Every 3 years starting at age 12 through age 56 or 75 with a 3 consecutive normal Pap tests. Testing can be stopped between 65 and 70 with 3 consecutive normal Pap tests and no abnormal Pap or HPV tests in the past 10 years.  HPV screening.** / Every 3 years from ages 34 through ages 42 or 82 with a history of 3 consecutive normal Pap tests. Testing can be stopped between 65 and 70 with 3 consecutive normal Pap tests and no abnormal Pap or HPV tests in the past 10 years.  Fecal occult blood test (FOBT) of stool. / Every year beginning at age 21 and continuing until age 73. You may not need to do this test if you get a colonoscopy every 10 years.  Flexible sigmoidoscopy or colonoscopy.** / Every 5 years for a flexible sigmoidoscopy or every 10 years for a colonoscopy beginning at age 74 and continuing until age 107.  Hepatitis C blood test.** / For all people born from [birth year] through 1966 and any individual with known risks for hepatitis C.  Osteoporosis screening.** / A one-time screening for women ages 4 and over and women at risk for fractures or osteoporosis.  Skin self-exam. / Monthly.  Influenza immunization.** / Every year.  Pneumococcal polysaccharide immunization.** / 1 dose at age 8 (or older) if you have never been vaccinated.  Tetanus, diphtheria, pertussis (Tdap, Td) immunization. / A one-time dose of Tdap vaccine if you are over 65 and have contact with an infant, are a Research scientist (physical sciences), or simply want to be protected from whooping cough. After that, you need a Td booster dose every 10  years.  Varicella immunization.** / Consult your caregiver.  Meningococcal immunization.** / Consult your caregiver.  Hepatitis A immunization.** / Consult your caregiver. 2 doses, 6 to 18 months apart.  Hepatitis B immunization.** / Check with your caregiver. 3 doses, usually over 6 months. ** Family history and personal history of risk and conditions may change your caregiver's recommendations. Document Released: 03/09/2001 Document Revised: 04/05/2011 Document Reviewed: 06/08/2010 [facility] Patient Information 2014 [place], [place].     Neta Mends. Panosh M.D.  Health Maintenance  Topic Date Due  . Pap Smear  12/28/1987  . Influenza Vaccine  08/25/2013  . Tetanus/tdap  12/03/2020   Health Maintenance Review }

## 2012-10-23 NOTE — Patient Instructions (Signed)
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  No sugar drinks   Cut down on all sugars sweets and simple carbs such as whit bread rice potatoes etc.  Read labels  Most processed foods have sugars fructose sucrose etc that can raise your blood sugar and help you gain weight Move more as possible to hwlp you back eventulally.  You will be contacted about referral for rheum eval for the hand pain and stiffness .  Also  surgery opinioin about the umbilical hernia   Preventive Care for Adults, Female A healthy lifestyle and preventive care can promote health and wellness. Preventive health guidelines for women include the following key practices.  A routine yearly physical is a good way to check with your caregiver about your health and preventive screening. It is a chance to share any concerns and updates on your health, and to receive a thorough exam.  Visit your dentist for a routine exam and preventive care every 6 months. Brush your teeth twice a day and floss once a day. Good oral hygiene prevents tooth decay and gum disease.  The frequency of eye exams is based on your age, health, family medical history, use of contact lenses, and other factors. Follow your caregiver's recommendations for frequency of eye exams.  Eat a healthy diet. Foods like vegetables, fruits, whole grains, low-fat dairy products, and lean protein foods contain the nutrients you need without too many calories. Decrease your intake of foods high in solid fats, added sugars, and salt. Eat the right amount of calories for you.Get information about a proper diet from your caregiver, if necessary.  Regular physical exercise is one of the most important things you can do for your health. Most adults should get at least 150 minutes of moderate-intensity exercise (any activity that increases your heart rate and causes you to sweat) each  week. In addition, most adults need muscle-strengthening exercises on 2 or more days a week.  Maintain a healthy weight. The body mass index (BMI) is a screening tool to identify possible weight problems. It provides an estimate of body fat based on height and weight. Your caregiver can help determine your BMI, and can help you achieve or maintain a healthy weight.For adults 20 years and older:  A BMI below 18.5 is considered underweight.  A BMI of 18.5 to 24.9 is normal.  A BMI of 25 to 29.9 is considered overweight.  A BMI of 30 and above is considered obese.  Maintain normal blood lipids and cholesterol levels by exercising and minimizing your intake of saturated fat. Eat a balanced diet with plenty of fruit and vegetables. Blood tests for lipids and cholesterol should begin at age 63 and be repeated every 5 years. If your lipid or cholesterol levels are high, you are over 50, or you are at high risk for heart disease, you may need your cholesterol levels checked more frequently.Ongoing high lipid and cholesterol levels should be treated with medicines if diet and exercise are not effective.  If you smoke, find out from your caregiver how to quit. If you do not use tobacco, do not start.  If you are pregnant, do not drink alcohol. If you are breastfeeding, be very cautious about drinking alcohol. If you are not pregnant and choose to drink alcohol, do not exceed 1 drink per day. One drink is considered to be 12 ounces (  355 mL) of beer, 5 ounces (148 mL) of wine, or 1.5 ounces (44 mL) of liquor.  Avoid use of street drugs. Do not share needles with anyone. Ask for help if you need support or instructions about stopping the use of drugs.  High blood pressure causes heart disease and increases the risk of stroke. Your blood pressure should be checked at least every 1 to 2 years. Ongoing high blood pressure should be treated with medicines if weight loss and exercise are not effective.  If you  are 18 to 43 years old, ask your caregiver if you should take aspirin to prevent strokes.  Diabetes screening involves taking a blood sample to check your fasting blood sugar level. This should be done once every 3 years, after age 35, if you are within normal weight and without risk factors for diabetes. Testing should be considered at a younger age or be carried out more frequently if you are overweight and have at least 1 risk factor for diabetes.  Breast cancer screening is essential preventive care for women. You should practice "breast self-awareness." This means understanding the normal appearance and feel of your breasts and may include breast self-examination. Any changes detected, no matter how small, should be reported to a caregiver. Women in their 94s and 30s should have a clinical breast exam (CBE) by a caregiver as part of a regular health exam every 1 to 3 years. After age 20, women should have a CBE every year. Starting at age 77, women should consider having a mammography (breast X-ray test) every year. Women who have a family history of breast cancer should talk to their caregiver about genetic screening. Women at a high risk of breast cancer should talk to their caregivers about having magnetic resonance imaging (MRI) and a mammography every year.  The Pap test is a screening test for cervical cancer. A Pap test can show cell changes on the cervix that might become cervical cancer if left untreated. A Pap test is a procedure in which cells are obtained and examined from the lower end of the uterus (cervix).  Women should have a Pap test starting at age 31.  Between ages 44 and 14, Pap tests should be repeated every 2 years.  Beginning at age 14, you should have a Pap test every 3 years as long as the past 3 Pap tests have been normal.  Some women have medical problems that increase the chance of getting cervical cancer. Talk to your caregiver about these problems. It is especially  important to talk to your caregiver if a new problem develops soon after your last Pap test. In these cases, your caregiver may recommend more frequent screening and Pap tests.  The above recommendations are the same for women who have or have not gotten the vaccine for human papillomavirus (HPV).  If you had a hysterectomy for a problem that was not cancer or a condition that could lead to cancer, then you no longer need Pap tests. Even if you no longer need a Pap test, a regular exam is a good idea to make sure no other problems are starting.  If you are between ages 71 and 28, and you have had normal Pap tests going back 10 years, you no longer need Pap tests. Even if you no longer need a Pap test, a regular exam is a good idea to make sure no other problems are starting.  If you have had past treatment for cervical cancer or  a condition that could lead to cancer, you need Pap tests and screening for cancer for at least 20 years after your treatment.  If Pap tests have been discontinued, risk factors (such as a new sexual partner) need to be reassessed to determine if screening should be resumed.  The HPV test is an additional test that may be used for cervical cancer screening. The HPV test looks for the virus that can cause the cell changes on the cervix. The cells collected during the Pap test can be tested for HPV. The HPV test could be used to screen women aged 54 years and older, and should be used in women of any age who have unclear Pap test results. After the age of 75, women should have HPV testing at the same frequency as a Pap test.  Colorectal cancer can be detected and often prevented. Most routine colorectal cancer screening begins at the age of 39 and continues through age 75. However, your caregiver may recommend screening at an earlier age if you have risk factors for colon cancer. On a yearly basis, your caregiver may provide home test kits to check for hidden blood in the stool.  Use of a small camera at the end of a tube, to directly examine the colon (sigmoidoscopy or colonoscopy), can detect the earliest forms of colorectal cancer. Talk to your caregiver about this at age 65, when routine screening begins. Direct examination of the colon should be repeated every 5 to 10 years through age 76, unless early forms of pre-cancerous polyps or small growths are found.  Hepatitis C blood testing is recommended for all people born from [birth year] through 1966 and any individual with known risks for hepatitis C.  Practice safe sex. Use condoms and avoid high-risk sexual practices to reduce the spread of sexually transmitted infections (STIs). STIs include gonorrhea, chlamydia, syphilis, trichomonas, herpes, HPV, and human immunodeficiency virus (HIV). Herpes, HIV, and HPV are viral illnesses that have no cure. They can result in disability, cancer, and death. Sexually active women aged 57 and younger should be checked for chlamydia. Older women with new or multiple partners should also be tested for chlamydia. Testing for other STIs is recommended if you are sexually active and at increased risk.  Osteoporosis is a disease in which the bones lose minerals and strength with aging. This can result in serious bone fractures. The risk of osteoporosis can be identified using a bone density scan. Women ages 45 and over and women at risk for fractures or osteoporosis should discuss screening with their caregivers. Ask your caregiver whether you should take a calcium supplement or vitamin D to reduce the rate of osteoporosis.  Menopause can be associated with physical symptoms and risks. Hormone replacement therapy is available to decrease symptoms and risks. You should talk to your caregiver about whether hormone replacement therapy is right for you.  Use sunscreen with sun protection factor (SPF) of 30 or more. Apply sunscreen liberally and repeatedly throughout the day. You should seek shade when  your shadow is shorter than you. Protect yourself by wearing long sleeves, pants, a wide-brimmed hat, and sunglasses year round, whenever you are outdoors.  Once a month, do a whole body skin exam, using a mirror to look at the skin on your back. Notify your caregiver of new moles, moles that have irregular borders, moles that are larger than a pencil eraser, or moles that have changed in shape or color.  Stay current with required immunizations.  Influenza.  You need a dose every fall (or winter). The composition of the flu vaccine changes each year, so being vaccinated once is not enough.  Pneumococcal polysaccharide. You need 1 to 2 doses if you smoke cigarettes or if you have certain chronic medical conditions. You need 1 dose at age 63 (or older) if you have never been vaccinated.  Tetanus, diphtheria, pertussis (Tdap, Td). Get 1 dose of Tdap vaccine if you are younger than age 16, are over 65 and have contact with an infant, are a Research scientist (physical sciences), are pregnant, or simply want to be protected from whooping cough. After that, you need a Td booster dose every 10 years. Consult your caregiver if you have not had at least 3 tetanus and diphtheria-containing shots sometime in your life or have a deep or dirty wound.  HPV. You need this vaccine if you are a woman age 40 or younger. The vaccine is given in 3 doses over 6 months.  Measles, mumps, rubella (MMR). You need at least 1 dose of MMR if you were born in 1957 or later. You may also need a second dose.  Meningococcal. If you are age 59 to 54 and a first-year college student living in a residence hall, or have one of several medical conditions, you need to get vaccinated against meningococcal disease. You may also need additional booster doses.  Zoster (shingles). If you are age 68 or older, you should get this vaccine.  Varicella (chickenpox). If you have never had chickenpox or you were vaccinated but received only 1 dose, talk to your  caregiver to find out if you need this vaccine.  Hepatitis A. You need this vaccine if you have a specific risk factor for hepatitis A virus infection or you simply wish to be protected from this disease. The vaccine is usually given as 2 doses, 6 to 18 months apart.  Hepatitis B. You need this vaccine if you have a specific risk factor for hepatitis B virus infection or you simply wish to be protected from this disease. The vaccine is given in 3 doses, usually over 6 months. Preventive Services / Frequency Ages 50 to 33  Blood pressure check.** / Every 1 to 2 years.  Lipid and cholesterol check.** / Every 5 years beginning at age 35.  Clinical breast exam.** / Every 3 years for women in their 49s and 30s.  Pap test.** / Every 2 years from ages 22 through 37. Every 3 years starting at age 15 through age 49 or 8 with a history of 3 consecutive normal Pap tests.  HPV screening.** / Every 3 years from ages 83 through ages 70 to 52 with a history of 3 consecutive normal Pap tests.  Hepatitis C blood test.** / For any individual with known risks for hepatitis C.  Skin self-exam. / Monthly.  Influenza immunization.** / Every year.  Pneumococcal polysaccharide immunization.** / 1 to 2 doses if you smoke cigarettes or if you have certain chronic medical conditions.  Tetanus, diphtheria, pertussis (Tdap, Td) immunization. / A one-time dose of Tdap vaccine. After that, you need a Td booster dose every 10 years.  HPV immunization. / 3 doses over 6 months, if you are 76 and younger.  Measles, mumps, rubella (MMR) immunization. / You need at least 1 dose of MMR if you were born in 1957 or later. You may also need a second dose.  Meningococcal immunization. / 1 dose if you are age 31 to 27 and a Orthoptist  living in a residence hall, or have one of several medical conditions, you need to get vaccinated against meningococcal disease. You may also need additional booster  doses.  Varicella immunization.** / Consult your caregiver.  Hepatitis A immunization.** / Consult your caregiver. 2 doses, 6 to 18 months apart.  Hepatitis B immunization.** / Consult your caregiver. 3 doses usually over 6 months. Ages 11 to 37  Blood pressure check.** / Every 1 to 2 years.  Lipid and cholesterol check.** / Every 5 years beginning at age 57.  Clinical breast exam.** / Every year after age 86.  Mammogram.** / Every year beginning at age 13 and continuing for as long as you are in good health. Consult with your caregiver.  Pap test.** / Every 3 years starting at age 101 through age 67 or 15 with a history of 3 consecutive normal Pap tests.  HPV screening.** / Every 3 years from ages 50 through ages 68 to 9 with a history of 3 consecutive normal Pap tests.  Fecal occult blood test (FOBT) of stool. / Every year beginning at age 73 and continuing until age 54. You may not need to do this test if you get a colonoscopy every 10 years.  Flexible sigmoidoscopy or colonoscopy.** / Every 5 years for a flexible sigmoidoscopy or every 10 years for a colonoscopy beginning at age 54 and continuing until age 51.  Hepatitis C blood test.** / For all people born from [birth year] through 1966 and any individual with known risks for hepatitis C.  Skin self-exam. / Monthly.  Influenza immunization.** / Every year.  Pneumococcal polysaccharide immunization.** / 1 to 2 doses if you smoke cigarettes or if you have certain chronic medical conditions.  Tetanus, diphtheria, pertussis (Tdap, Td) immunization.** / A one-time dose of Tdap vaccine. After that, you need a Td booster dose every 10 years.  Measles, mumps, rubella (MMR) immunization. / You need at least 1 dose of MMR if you were born in 1957 or later. You may also need a second dose.  Varicella immunization.** / Consult your caregiver.  Meningococcal immunization.** / Consult your caregiver.  Hepatitis A immunization.** / Consult  your caregiver. 2 doses, 6 to 18 months apart.  Hepatitis B immunization.** / Consult your caregiver. 3 doses, usually over 6 months. Ages 10 and over  Blood pressure check.** / Every 1 to 2 years.  Lipid and cholesterol check.** / Every 5 years beginning at age 61.  Clinical breast exam.** / Every year after age 68.  Mammogram.** / Every year beginning at age 11 and continuing for as long as you are in good health. Consult with your caregiver.  Pap test.** / Every 3 years starting at age 65 through age 55 or 19 with a 3 consecutive normal Pap tests. Testing can be stopped between 65 and 70 with 3 consecutive normal Pap tests and no abnormal Pap or HPV tests in the past 10 years.  HPV screening.** / Every 3 years from ages 48 through ages 59 or 81 with a history of 3 consecutive normal Pap tests. Testing can be stopped between 65 and 70 with 3 consecutive normal Pap tests and no abnormal Pap or HPV tests in the past 10 years.  Fecal occult blood test (FOBT) of stool. / Every year beginning at age 27 and continuing until age 28. You may not need to do this test if you get a colonoscopy every 10 years.  Flexible sigmoidoscopy or colonoscopy.** / Every 5 years for a  flexible sigmoidoscopy or every 10 years for a colonoscopy beginning at age 43 and continuing until age 22.  Hepatitis C blood test.** / For all people born from [birth year] through 1966 and any individual with known risks for hepatitis C.  Osteoporosis screening.** / A one-time screening for women ages 45 and over and women at risk for fractures or osteoporosis.  Skin self-exam. / Monthly.  Influenza immunization.** / Every year.  Pneumococcal polysaccharide immunization.** / 1 dose at age 41 (or older) if you have never been vaccinated.  Tetanus, diphtheria, pertussis (Tdap, Td) immunization. / A one-time dose of Tdap vaccine if you are over 65 and have contact with an infant, are a Research scientist (physical sciences), or simply want to be protected  from whooping cough. After that, you need a Td booster dose every 10 years.  Varicella immunization.** / Consult your caregiver.  Meningococcal immunization.** / Consult your caregiver.  Hepatitis A immunization.** / Consult your caregiver. 2 doses, 6 to 18 months apart.  Hepatitis B immunization.** / Check with your caregiver. 3 doses, usually over 6 months. ** Family history and personal history of risk and conditions may change your caregiver's recommendations. Document Released: 03/09/2001 Document Revised: 04/05/2011 Document Reviewed: 06/08/2010 [facility] Patient Information 2014 [place], [place].

## 2012-10-25 NOTE — Progress Notes (Signed)
Quick Note:  Tell patient PAP is normal. ______ 

## 2012-10-26 ENCOUNTER — Encounter: Payer: Self-pay | Admitting: Family Medicine

## 2012-10-30 ENCOUNTER — Ambulatory Visit (INDEPENDENT_AMBULATORY_CARE_PROVIDER_SITE_OTHER): Payer: Medicaid Other | Admitting: Surgery

## 2012-11-17 ENCOUNTER — Other Ambulatory Visit: Payer: Self-pay | Admitting: Family Medicine

## 2012-11-17 MED ORDER — DULOXETINE HCL 60 MG PO CPEP: ORAL_CAPSULE | ORAL | Status: DC

## 2013-03-06 ENCOUNTER — Encounter: Payer: Self-pay | Admitting: Internal Medicine

## 2013-03-06 ENCOUNTER — Ambulatory Visit (INDEPENDENT_AMBULATORY_CARE_PROVIDER_SITE_OTHER): Payer: Medicaid Other | Admitting: Internal Medicine

## 2013-03-06 VITALS — BP 132/84 | Temp 98.8°F | Ht 69.25 in | Wt 330.0 lb

## 2013-03-06 DIAGNOSIS — G8929 Other chronic pain: Secondary | ICD-10-CM

## 2013-03-06 DIAGNOSIS — R200 Anesthesia of skin: Secondary | ICD-10-CM | POA: Insufficient documentation

## 2013-03-06 DIAGNOSIS — R209 Unspecified disturbances of skin sensation: Secondary | ICD-10-CM

## 2013-03-06 DIAGNOSIS — M549 Dorsalgia, unspecified: Secondary | ICD-10-CM

## 2013-03-06 DIAGNOSIS — M1990 Unspecified osteoarthritis, unspecified site: Secondary | ICD-10-CM

## 2013-03-06 DIAGNOSIS — R202 Paresthesia of skin: Principal | ICD-10-CM | POA: Insufficient documentation

## 2013-03-06 NOTE — Patient Instructions (Signed)
Exam is reassuring .but I want to  Get neurology to evaluate the numbness tingling.   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

## 2013-03-06 NOTE — Progress Notes (Signed)
Chief Complaint  Patient presents with  . Back Pain    chronic    HPI: Patient comes in today for SDA for  new problem evaluation. Progression of back pain and leg  Numbness  recnetly had  Numbness  Both legs   Had an episode that was scary to her where she went to the bathroom and he just sat down and her legs went non-with a shooting tingling feeling. Had to stand up and it took at least 10 minutes for tingling in her feet to go away Legs shototing numbness and tingling in feet for at least 10 minutes.    Feels weak legs give out when happening  has had twinges of such in the past but were very transient. Mode history of an episode diagnosed as inner ear problem but she actually had some head neck weakness and diplopia. She sometimes gets pain radiating from her lower thoracic up to the back of her neck but not to the head. Has seen rheumatologist [facility] And had tender spots. L. to be fibromyalgia and DJD Had seen dr Shawna Orleans  Last year  And felt it was DJD .  Not RA  But FM  Type sx.  Hard to do  Housework and wg. But she was learning to cope with this Not nocturnal as scared  .  Blood tests  . Done at the rheumatologists felt that given to her felt she could see PCP unless there was a new problem   X ray  MVa over 10 years ago no imaging of back just of the abdomen and for renal stones. ROS: See pertinent positives and negatives per HPI. No current fever vision problems head trauma recently falling.  Has had episodes shaking feel weak but not particularly numb. Not use Ativan for a long time thyroid last checked was okay   Past Medical History  Diagnosis Date  . Hypothyroid   . Anxiety     reactive   . Allergy   . History of nephrolithiasis 11/2008    under urology care uric acid rx ?  . Varicose veins     Family History  Problem Relation Age of Onset  . Breast cancer Mother     deceased 17-May-2006 2007/05/17  . ADD / ADHD Son   . Osteoarthritis Mother     History   Social History   . Marital Status: Married    Spouse Name: N/A    Number of Children: N/A  . Years of Education: N/A   Social History Main Topics  . Smoking status: Never Smoker   . Smokeless tobacco: None  . Alcohol Use: No  . Drug Use: No  . Sexual Activity: None   Other Topics Concern  . None   Social History Narrative   Divorced .    Neg etoh tea.    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   Childbirth x 1   Mom and child in counseling doing well with support  Counseling     Father with  Hx os anger and other dx .   Divorce  June    Has BF supportive  and lives with son .  Outpatient Encounter Prescriptions as of 03/06/2013  Medication Sig  . cetirizine (ZYRTEC) 10 MG tablet Take 10 mg by mouth daily.  . DULoxetine (CYMBALTA) 60 MG capsule TAKE ONE CAPSULE BY MOUTH ONCE DAILY  . levothyroxine (SYNTHROID, LEVOTHROID) 112 MCG tablet TAKE ONE TABLET BY MOUTH ONCE DAILY  . methocarbamol (ROBAXIN) 500 MG tablet Take 1 tablet (500 mg total) by mouth 3 (three) times daily as needed.  Marland Kitchen LORazepam (ATIVAN) 0.5 MG tablet Take 1 tablet (0.5 mg total) by mouth every 8 (eight) hours as needed for anxiety.    EXAM:  BP 132/84  Temp(Src) 98.8 F (37.1 C) (Oral)  Ht 5' 9.25" (1.759 m)  Wt 330 lb (149.687 kg)  BMI 48.38 kg/m2  Body mass index is 48.38 kg/(m^2).  GENERAL: vitals reviewed and listed above, alert, oriented, appears well hydrated and in no acute distress HEENT: atraumatic, conjunctiva  clear, no obvious abnormalities on inspection of external nose and ears NECK: no obvious masses on inspection palpation no bruits are heard LUNGS: clear to auscultation bilaterally, no wheezes, rales or rhonchi, good air movement Back is a bit uncomfortable prefers to stand or lean back toe heel walk is normal DTRs are difficult to elicit but not brisk no fasciculations or obvious focal  weakness. CV: HRRR, no clubbing cyanosis or  peripheral edema nl cap refill  PSYCH: pleasant and cooperative, no obvious depression or anxiety  ASSESSMENT AND PLAN:  Discussed the following assessment and plan:  Numbness and tingling of both legs - episodic  legs episodic . insetting of back pain. ? if need imaging spine vs head. can really expalin by djd and FM  Back pain, chronic  DJD (degenerative joint disease) - also given dx of fibro? and on good meds for this  . Get neurology consult question advisability of imaging could there be demyelinating disease on the top of her other situation. She's had evaluation for her back but no imaging appropriately. These recent symptoms were scary to her and that is what brought her in today. -Patient advised to return or notify health care team  if symptoms worsen or persist or new concerns arise.  Patient Instructions  Exam is reassuring .but I want to  Get neurology to evaluate the numbness tingling.   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    [name]. [name] M.D.

## 2013-03-06 NOTE — Progress Notes (Signed)
Pre visit review using our clinic review tool, if applicable. No additional management support is needed unless otherwise documented below in the visit note. 

## 2013-03-08 ENCOUNTER — Encounter: Payer: Self-pay | Admitting: Family

## 2013-03-08 ENCOUNTER — Ambulatory Visit (INDEPENDENT_AMBULATORY_CARE_PROVIDER_SITE_OTHER): Payer: Medicaid Other | Admitting: Family

## 2013-03-08 VITALS — BP 122/84 | HR 83 | Wt 330.0 lb

## 2013-03-08 DIAGNOSIS — L03221 Cellulitis of neck: Secondary | ICD-10-CM

## 2013-03-08 DIAGNOSIS — L0211 Cutaneous abscess of neck: Secondary | ICD-10-CM

## 2013-03-08 MED ORDER — SULFAMETHOXAZOLE-TRIMETHOPRIM 800-160 MG PO TABS: 1.0000 | ORAL_TABLET | Freq: Two times a day (BID) | ORAL | Status: AC

## 2013-03-08 NOTE — Progress Notes (Signed)
Subjective:    Patient ID: [name], female    DOB: April 04, 1970, 43 y.o.   MRN: [id number]  HPI 43 yo white female presents to PCP for bump to L side of neck; pt states bump appeared Tuesday evening and has since gotten bigger and painful. Reports hx of recurrent skin infections. Denies any fevers, chills or drainage from area. Denies any at home treatments for issue.    Review of Systems  Constitutional: Negative.   HENT: Negative.   Respiratory: Negative.   Skin:       Bump to L side of neck       Past Medical History  Diagnosis Date  . Hypothyroid   . Anxiety     reactive   . Allergy   . History of nephrolithiasis 11/2008    under urology care uric acid rx ?  . Varicose veins     History   Social History  . Marital Status: Married    Spouse Name: N/A    Number of Children: N/A  . Years of Education: N/A   Occupational History  . Not on file.   Social History Main Topics  . Smoking status: Never Smoker   . Smokeless tobacco: Not on file  . Alcohol Use: No  . Drug Use: No  . Sexual Activity: Not on file   Other Topics Concern  . Not on file   Social History Narrative   Divorced .    Neg etoh tea.    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   Childbirth x 1   Mom and child in counseling doing well with support  Counseling     Father with  Hx os anger and other dx .   Divorce  June    Has BF supportive  and lives with son .                                       Past Surgical History  Procedure Laterality Date  . Carpal tunnel release    . Rt knee surgery  08/1987  . Foot surgery    . Cholecystectomy    . Lithotripsy      twice by Dr. Gaynelle Arabian  . Teeth extracted      on top for decay    Family History  Problem Relation Age of Onset  . Breast cancer Mother     deceased May 11, 2006 05/11/07  . ADD / ADHD Son   . Osteoarthritis Mother     Allergies  Allergen  Reactions  . Nalbuphine   . Ondansetron     Current Outpatient Prescriptions on File Prior to Visit  Medication Sig Dispense Refill  . cetirizine (ZYRTEC) 10 MG tablet Take 10 mg by mouth daily.      . DULoxetine (CYMBALTA) 60 MG capsule TAKE ONE CAPSULE BY MOUTH ONCE DAILY  30 capsule  5  . levothyroxine (SYNTHROID, LEVOTHROID) 112 MCG tablet TAKE ONE TABLET BY MOUTH ONCE DAILY  90 tablet  3  . LORazepam (ATIVAN) 0.5 MG tablet Take 1 tablet (0.5 mg total) by mouth every 8 (eight) hours as needed for anxiety.  30 tablet  0  . methocarbamol (ROBAXIN) 500 MG tablet Take 1 tablet (500 mg total) by mouth 3 (three) times  daily as needed.  30 tablet  0   No current facility-administered medications on file prior to visit.    BP 122/84  Pulse 83  Wt 330 lb (149.687 kg)chart Objective:   Physical Exam  Constitutional: She appears well-developed.  Skin:  Erythematous quarter sized abscess to L lateral aspect of neck    I and D abscess left neck: Informed consent obtained. Under sterile technique, 1-1/2 cc of lidocaine injected to anesthetize the access to the left neck. 0.5 cm incision 2 weeks they'll a small amount of pustular and bloody discharge from the abscess. Bleeding controlled with compression. Patient tolerated the procedure well. Care instructions for home given.      Assessment & Plan:  Daanya was seen today for recurrent skin infections.  Diagnoses and associated orders for this visit:  Neck abscess  Other Orders - sulfamethoxazole-trimethoprim (BACTRIM DS,SEPTRA DS) 800-160 MG per tablet; Take 1 tablet by mouth 2 (two) times daily.   Note by Wilmon Pali, FNP Student

## 2013-03-08 NOTE — Progress Notes (Signed)
Pre visit review using our clinic review tool, if applicable. No additional management support is needed unless otherwise documented below in the visit note. 

## 2013-03-08 NOTE — Patient Instructions (Signed)
Incision and Drainage   Care After   Refer to this sheet in the next few weeks. These instructions provide you with information on caring for yourself after your procedure. Your caregiver may also give you more specific instructions. Your treatment has been planned according to current medical practices, but problems sometimes occur. Call your caregiver if you have any problems or questions after your procedure.   HOME CARE INSTRUCTIONS   If antibiotic medicine is given, take it as directed. Finish it even if you start to feel better.   Only take over-the-counter or prescription medicines for pain, discomfort, or fever as directed by your caregiver.   Keep all follow-up appointments as directed by your caregiver.   Change any bandages (dressings) as directed by your caregiver. Replace old dressings with clean dressings.   Wash your hands before and after caring for your wound.  You will receive specific instructions for cleansing and caring for your wound.   SEEK MEDICAL CARE IF:   You have increased pain, swelling, or redness around the wound.   You have increased drainage, smell, or bleeding from the wound.   You have muscle aches, chills, or you feel generally sick.   You have a fever.  MAKE SURE YOU:   Understand these instructions.   Will watch your condition.   Will get help right away if you are not doing well or get worse.  Document Released: 04/05/2011 Document Reviewed: 04/05/2011   [employer]® Patient Information ©2014 [employer].

## 2013-03-11 LAB — WOUND CULTURE
GRAM STAIN: NONE SEEN
Gram Stain: NONE SEEN

## 2013-03-27 ENCOUNTER — Encounter: Payer: Self-pay | Admitting: Internal Medicine

## 2013-04-16 ENCOUNTER — Other Ambulatory Visit (INDEPENDENT_AMBULATORY_CARE_PROVIDER_SITE_OTHER): Payer: Medicaid Other

## 2013-04-16 ENCOUNTER — Ambulatory Visit (INDEPENDENT_AMBULATORY_CARE_PROVIDER_SITE_OTHER): Payer: Medicaid Other | Admitting: Internal Medicine

## 2013-04-16 ENCOUNTER — Encounter: Payer: Self-pay | Admitting: Internal Medicine

## 2013-04-16 VITALS — BP 128/84 | HR 67 | Temp 98.7°F | Ht 69.25 in | Wt 337.0 lb

## 2013-04-16 DIAGNOSIS — R739 Hyperglycemia, unspecified: Secondary | ICD-10-CM

## 2013-04-16 DIAGNOSIS — R7309 Other abnormal glucose: Secondary | ICD-10-CM

## 2013-04-16 DIAGNOSIS — J019 Acute sinusitis, unspecified: Secondary | ICD-10-CM

## 2013-04-16 LAB — HEMOGLOBIN A1C: Hgb A1c MFr Bld: 5.8 % (ref 4.6–6.5)

## 2013-04-16 MED ORDER — AMOXICILLIN-POT CLAVULANATE 875-125 MG PO TABS: 1.0000 | ORAL_TABLET | Freq: Two times a day (BID) | ORAL | Status: DC

## 2013-04-16 MED ORDER — HYDROCODONE-HOMATROPINE 5-1.5 MG/5ML PO SYRP: 5.0000 mL | ORAL_SOLUTION | ORAL | Status: DC | PRN

## 2013-04-16 NOTE — Progress Notes (Signed)
Pre visit review using our clinic review tool, if applicable. No additional management support is needed unless otherwise documented below in the visit note.   Chief Complaint  Patient presents with  . Sinus Pressure    Started 2 wks ago and has become worse over the past week.  Is currently taking Delsym DM and has completed an other bottle of plain Delsym.  With no relief.  . Fever  . Cough  . Generalized Body Aches  . Hoarse  . Post Nasal Drip    HPI: Patient comes in today for SDA for  new problem evaluation. onsnet 2 -3 weeks ago and then yestday  Had temp 101 yesterday Onset with sneexing and itchy throat and then progressed . Cough and now stuffiness and head pan. Left maxillary and frontal areas no unusual rashes hemoptysis green nasal drainage dry cough Sob  only during coughing spells left side of face to top iof head is sharp.  ROS: See pertinent positives and negatives per HPI.  Past Medical History  Diagnosis Date  . Hypothyroid   . Anxiety     reactive   . Allergy   . History of nephrolithiasis 11/2008    under urology care uric acid rx ?  . Varicose veins     Family History  Problem Relation Age of Onset  . Breast cancer Mother     deceased 2006-05-10 10-May-2007  . ADD / ADHD Son   . Osteoarthritis Mother     History   Social History  . Marital Status: Married    Spouse Name: N/A    Number of Children: N/A  . Years of Education: N/A   Social History Main Topics  . Smoking status: Never Smoker   . Smokeless tobacco: None  . Alcohol Use: No  . Drug Use: No  . Sexual Activity: None   Other Topics Concern  . None   Social History Narrative   Divorced .    Neg etoh tea.    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   Childbirth x 1   Mom and child in counseling doing well with support  Counseling     Father with  Hx os anger and other dx .   Divorce  June    Has BF supportive  and lives  with son .                                       Outpatient Encounter Prescriptions as of 04/16/2013  Medication Sig  . cetirizine (ZYRTEC) 10 MG tablet Take 10 mg by mouth daily.  . DULoxetine (CYMBALTA) 60 MG capsule TAKE ONE CAPSULE BY MOUTH ONCE DAILY  . levothyroxine (SYNTHROID, LEVOTHROID) 112 MCG tablet TAKE ONE TABLET BY MOUTH ONCE DAILY  . LORazepam (ATIVAN) 0.5 MG tablet Take 1 tablet (0.5 mg total) by mouth every 8 (eight) hours as needed for anxiety.  . methocarbamol (ROBAXIN) 500 MG tablet Take 1 tablet (500 mg total) by mouth 3 (three) times daily as needed.  Marland Kitchen amoxicillin-clavulanate (AUGMENTIN) 875-125 MG per tablet Take 1 tablet by mouth every 12 (twelve) hours. For sinusitis  . HYDROcodone-homatropine (HYCODAN) 5-1.5 MG/5ML syrup Take 5 mLs by mouth every 4 (four) hours as needed for cough.    EXAM:  BP 128/84  Pulse 67  Temp(Src) 98.7 F (37.1 C) (Oral)  Ht 5' 9.25" (1.759 m)  Wt 337 lb (152.862 kg)  BMI 49.40 kg/m2  SpO2 98%  Body mass index is 49.4 kg/(m^2). WDWN in NAD  quiet respirations; mildly congested  somewhat hoarse. Non toxic . HEENT: Normocephalic ;atraumatic , Eyes;  PERRL, EOMs  Full, lids and conjunctiva clear,,Ears: no deformities, canals nl, TM landmarks normal, Nose: no deformity or discharge but congested;face very tender left maxilla and frontal area  tender Mouth : OP clear without lesion or edema . Neck: Supple without adenopathy or masses or bruits Chest:  Clear to A without wheezes rales or rhonchi CV:  S1-S2 no gallops or murmurs peripheral perfusion is normal Skin :nl perfusion and no acute rashes   ASSESSMENT AND PLAN:  Discussed the following assessment and plan:  Acute sinusitis with symptoms greater than 10 days - left maxill and frontal area  Augmentin local measures    Expectant management. Cough med if needed  -Patient advised to return or notify health care team  if symptoms worsen ,persist or new concerns  arise.  Patient Instructions  Take antibiotic saline nose spray cough med as needed  Expect improvement in 3-5 days.   Sinusitis Sinusitis is redness, soreness, and swelling (inflammation) of the paranasal sinuses. Paranasal sinuses are air pockets within the bones of your face (beneath the eyes, the middle of the forehead, or above the eyes). In healthy paranasal sinuses, mucus is able to drain out, and air is able to circulate through them by way of your nose. However, when your paranasal sinuses are inflamed, mucus and air can become trapped. This can allow bacteria and other germs to grow and cause infection. Sinusitis can develop quickly and last only a short time (acute) or continue over a long period (chronic). Sinusitis that lasts for more than 12 weeks is considered chronic.  CAUSES  Causes of sinusitis include:  Allergies.  Structural abnormalities, such as displacement of the cartilage that separates your nostrils (deviated septum), which can decrease the air flow through your nose and sinuses and affect sinus drainage.  Functional abnormalities, such as when the small hairs (cilia) that line your sinuses and help remove mucus do not work properly or are not present. SYMPTOMS  Symptoms of acute and chronic sinusitis are the same. The primary symptoms are pain and pressure around the affected sinuses. Other symptoms include:  Upper toothache.  Earache.  Headache.  Bad breath.  Decreased sense of smell and taste.  A cough, which worsens when you are lying flat.  Fatigue.  Fever.  Thick drainage from your nose, which often is green and may contain pus (purulent).  Swelling and warmth over the affected sinuses. DIAGNOSIS  Your caregiver will perform a physical exam. During the exam, your caregiver may:  Look in your nose for signs of abnormal growths in your nostrils (nasal polyps).  Tap over the affected sinus to check for signs of infection.  View the inside of  your sinuses (endoscopy) with a special imaging device with a light attached (endoscope), which is inserted into your sinuses. If your caregiver suspects that you have chronic sinusitis, one or more of the following tests may be recommended:  Allergy tests.  Nasal culture A sample of mucus is taken from your nose and sent to a lab and screened for bacteria.  Nasal cytology A sample of mucus is taken from your nose and examined by your caregiver to determine if your sinusitis  is related to an allergy. TREATMENT  Most cases of acute sinusitis are related to a viral infection and will resolve on their own within 10 days. Sometimes medicines are prescribed to help relieve symptoms (pain medicine, decongestants, nasal steroid sprays, or saline sprays).  However, for sinusitis related to a bacterial infection, your caregiver will prescribe antibiotic medicines. These are medicines that will help kill the bacteria causing the infection.  Rarely, sinusitis is caused by a fungal infection. In theses cases, your caregiver will prescribe antifungal medicine. For some cases of chronic sinusitis, surgery is needed. Generally, these are cases in which sinusitis recurs more than 3 times per year, despite other treatments. HOME CARE INSTRUCTIONS   Drink plenty of water. Water helps thin the mucus so your sinuses can drain more easily.  Use a humidifier.  Inhale steam 3 to 4 times a day (for example, sit in the bathroom with the shower running).  Apply a warm, moist washcloth to your face 3 to 4 times a day, or as directed by your caregiver.  Use saline nasal sprays to help moisten and clean your sinuses.  Take over-the-counter or prescription medicines for pain, discomfort, or fever only as directed by your caregiver. SEEK IMMEDIATE MEDICAL CARE IF:  You have increasing pain or severe headaches.  You have nausea, vomiting, or drowsiness.  You have swelling around your face.  You have vision  problems.  You have a stiff neck.  You have difficulty breathing. MAKE SURE YOU:   Understand these instructions.  Will watch your condition.  Will get help right away if you are not doing well or get worse. Document Released: 01/11/2005 Document Revised: 04/05/2011 Document Reviewed: 01/26/2011 [facility] Patient Information 2014 [place], [place].      Standley Brooking. Panosh M.D.

## 2013-04-16 NOTE — Patient Instructions (Signed)
Take antibiotic saline nose spray cough med as needed  Expect improvement in 3-5 days.   Sinusitis Sinusitis is redness, soreness, and swelling (inflammation) of the paranasal sinuses. Paranasal sinuses are air pockets within the bones of your face (beneath the eyes, the middle of the forehead, or above the eyes). In healthy paranasal sinuses, mucus is able to drain out, and air is able to circulate through them by way of your nose. However, when your paranasal sinuses are inflamed, mucus and air can become trapped. This can allow bacteria and other germs to grow and cause infection. Sinusitis can develop quickly and last only a short time (acute) or continue over a long period (chronic). Sinusitis that lasts for more than 12 weeks is considered chronic.  CAUSES  Causes of sinusitis include:  Allergies.  Structural abnormalities, such as displacement of the cartilage that separates your nostrils (deviated septum), which can decrease the air flow through your nose and sinuses and affect sinus drainage.  Functional abnormalities, such as when the small hairs (cilia) that line your sinuses and help remove mucus do not work properly or are not present. SYMPTOMS  Symptoms of acute and chronic sinusitis are the same. The primary symptoms are pain and pressure around the affected sinuses. Other symptoms include:  Upper toothache.  Earache.  Headache.  Bad breath.  Decreased sense of smell and taste.  A cough, which worsens when you are lying flat.  Fatigue.  Fever.  Thick drainage from your nose, which often is green and may contain pus (purulent).  Swelling and warmth over the affected sinuses. DIAGNOSIS  Your caregiver will perform a physical exam. During the exam, your caregiver may:  Look in your nose for signs of abnormal growths in your nostrils (nasal polyps).  Tap over the affected sinus to check for signs of infection.  View the inside of your sinuses (endoscopy) with a  special imaging device with a light attached (endoscope), which is inserted into your sinuses. If your caregiver suspects that you have chronic sinusitis, one or more of the following tests may be recommended:  Allergy tests.  Nasal culture A sample of mucus is taken from your nose and sent to a lab and screened for bacteria.  Nasal cytology A sample of mucus is taken from your nose and examined by your caregiver to determine if your sinusitis is related to an allergy. TREATMENT  Most cases of acute sinusitis are related to a viral infection and will resolve on their own within 10 days. Sometimes medicines are prescribed to help relieve symptoms (pain medicine, decongestants, nasal steroid sprays, or saline sprays).  However, for sinusitis related to a bacterial infection, your caregiver will prescribe antibiotic medicines. These are medicines that will help kill the bacteria causing the infection.  Rarely, sinusitis is caused by a fungal infection. In theses cases, your caregiver will prescribe antifungal medicine. For some cases of chronic sinusitis, surgery is needed. Generally, these are cases in which sinusitis recurs more than 3 times per year, despite other treatments. HOME CARE INSTRUCTIONS   Drink plenty of water. Water helps thin the mucus so your sinuses can drain more easily.  Use a humidifier.  Inhale steam 3 to 4 times a day (for example, sit in the bathroom with the shower running).  Apply a warm, moist washcloth to your face 3 to 4 times a day, or as directed by your caregiver.  Use saline nasal sprays to help moisten and clean your sinuses.  Take over-the-counter  or prescription medicines for pain, discomfort, or fever only as directed by your caregiver. SEEK IMMEDIATE MEDICAL CARE IF:  You have increasing pain or severe headaches.  You have nausea, vomiting, or drowsiness.  You have swelling around your face.  You have vision problems.  You have a stiff  neck.  You have difficulty breathing. MAKE SURE YOU:   Understand these instructions.  Will watch your condition.  Will get help right away if you are not doing well or get worse. Document Released: 01/11/2005 Document Revised: 04/05/2011 Document Reviewed: 01/26/2011 [facility] Patient Information 2014 [place], [place].

## 2013-04-23 ENCOUNTER — Ambulatory Visit (INDEPENDENT_AMBULATORY_CARE_PROVIDER_SITE_OTHER): Payer: Medicaid Other | Admitting: Internal Medicine

## 2013-04-23 ENCOUNTER — Encounter: Payer: Self-pay | Admitting: Internal Medicine

## 2013-04-23 VITALS — BP 120/70 | Temp 98.7°F | Ht 69.25 in | Wt 334.0 lb

## 2013-04-23 DIAGNOSIS — IMO0001 Reserved for inherently not codable concepts without codable children: Secondary | ICD-10-CM

## 2013-04-23 DIAGNOSIS — E039 Hypothyroidism, unspecified: Secondary | ICD-10-CM

## 2013-04-23 DIAGNOSIS — Z79899 Other long term (current) drug therapy: Secondary | ICD-10-CM

## 2013-04-23 DIAGNOSIS — F4322 Adjustment disorder with anxiety: Secondary | ICD-10-CM

## 2013-04-23 DIAGNOSIS — M797 Fibromyalgia: Secondary | ICD-10-CM | POA: Insufficient documentation

## 2013-04-23 DIAGNOSIS — R7309 Other abnormal glucose: Secondary | ICD-10-CM

## 2013-04-23 MED ORDER — DULOXETINE HCL 60 MG PO CPEP: ORAL_CAPSULE | ORAL | Status: DC

## 2013-04-23 MED ORDER — LORAZEPAM 0.5 MG PO TABS: 0.5000 mg | ORAL_TABLET | Freq: Three times a day (TID) | ORAL | Status: DC | PRN

## 2013-04-23 MED ORDER — LEVOTHYROXINE SODIUM 112 MCG PO TABS: ORAL_TABLET | ORAL | Status: DC

## 2013-04-23 NOTE — Patient Instructions (Signed)
Consider getting back with counselor   CBT to help with the anxiety .  Wellness visit  And cpx and hg a1c in 6 month .   Avoid simple sugars and carbs as discussed . Processed foods. Etc

## 2013-04-23 NOTE — Progress Notes (Signed)
Chief Complaint  Patient presents with  . Follow-up    HPI: [name]  comes in today for follow up of  multiple medical problems.   elevaetd fbs last check no signs of diabetes  Last week has sinusitis feeling much better  Numbness back etc. When coughed. Hasn't seen a neurologist yet transient  Has seen a rheumatologist and is felt that she could have fibromyalgia and Cymbalta was a good choice.  Mood ;med management  cymbalta helping FM   Anxiety doesn't handle as much as much.   1-2 per week. A lorazepam but is still doing okay. Neck again other is helpful but still has to do with her ex. Finds herself sometimes irritable. Her son is doing very well in school and is on the golf team he still so much better than originally  ROS: See pertinent positives and negatives per HPI. No cv pulm new gi sx   Past Medical History  Diagnosis Date  . Hypothyroid   . Anxiety     reactive   . Allergy   . History of nephrolithiasis 11/2008    under urology care uric acid rx ?  . Varicose veins     Family History  Problem Relation Age of Onset  . Breast cancer Mother     deceased Apr 30, 2006 April 30, 2007  . ADD / ADHD Son   . Osteoarthritis Mother     History   Social History  . Marital Status: Married    Spouse Name: N/A    Number of Children: N/A  . Years of Education: N/A   Social History Main Topics  . Smoking status: Never Smoker   . Smokeless tobacco: None  . Alcohol Use: No  . Drug Use: No  . Sexual Activity: None   Other Topics Concern  . None   Social History Narrative   Divorced .    Neg etoh tea.    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   Childbirth x 1   Mom and child in counseling doing well with support  Counseling     Father with  Hx os anger and other dx .   Divorce  June    Has BF supportive  and lives with son .                        Outpatient Encounter Prescriptions as  of 04/23/2013  Medication Sig  . amoxicillin-clavulanate (AUGMENTIN) 875-125 MG per tablet Take 1 tablet by mouth every 12 (twelve) hours. For sinusitis  . cetirizine (ZYRTEC) 10 MG tablet Take 10 mg by mouth daily.  . DULoxetine (CYMBALTA) 60 MG capsule TAKE ONE CAPSULE BY MOUTH ONCE DAILY  . levothyroxine (SYNTHROID, LEVOTHROID) 112 MCG tablet TAKE ONE TABLET BY MOUTH ONCE DAILY  . LORazepam (ATIVAN) 0.5 MG tablet Take 1 tablet (0.5 mg total) by mouth every 8 (eight) hours as needed for anxiety.  . methocarbamol (ROBAXIN) 500 MG tablet Take 1 tablet (500 mg total) by mouth 3 (three) times daily as needed.  . [DISCONTINUED] DULoxetine (CYMBALTA) 60 MG capsule TAKE ONE CAPSULE BY MOUTH ONCE DAILY  . [DISCONTINUED] levothyroxine (SYNTHROID, LEVOTHROID) 112 MCG tablet TAKE ONE TABLET BY MOUTH ONCE DAILY  . [DISCONTINUED] LORazepam (ATIVAN) 0.5 MG tablet Take 1 tablet (0.5 mg total) by mouth every 8 (eight) hours as needed for  anxiety.  . [DISCONTINUED] HYDROcodone-homatropine (HYCODAN) 5-1.5 MG/5ML syrup Take 5 mLs by mouth every 4 (four) hours as needed for cough.    EXAM:  BP 120/70  Temp(Src) 98.7 F (37.1 C) (Oral)  Ht 5' 9.25" (1.759 m)  Wt 334 lb (151.501 kg)  BMI 48.96 kg/m2  Body mass index is 48.96 kg/(m^2).  GENERAL: vitals reviewed and listed above, alert, oriented, appears well hydrated and in no acute distress minimally congested HEENT: atraumatic, conjunctiva  clear, no obvious abnormalities on inspection of external nose and ears OP : no lesion edema or exudate  NECK: no obvious masses on inspection palpation  LUNGS: clear to auscultation bilaterally, no wheezes, rales or rhonchi, good air movement CV: HRRR, no clubbing cyanosis or  peripheral edema nl cap refill  MS: moves all extremities without noticeable focal  abnormality PSYCH: pleasant and cooperative, no obvious depression or anxiety Lab Results  Component Value Date   WBC 6.0 10/11/2012   HGB 13.9 10/11/2012    HCT 40.6 10/11/2012   PLT 206.0 10/11/2012   GLUCOSE 112* 10/11/2012   CHOL 172 10/11/2012   TRIG 62.0 10/11/2012   HDL 37.60* 10/11/2012   LDLDIRECT 143.2 06/06/2009   LDLCALC 122* 10/11/2012   ALT 16 10/11/2012   AST 14 10/11/2012   NA 136 10/11/2012   K 3.8 10/11/2012   CL 105 10/11/2012   CREATININE 1.0 10/11/2012   BUN 10 10/11/2012   CO2 27 10/11/2012   TSH 1.52 10/11/2012   HGBA1C 5.8 04/16/2013    ASSESSMENT AND PLAN:  Discussed the following assessment and plan:  HYPERGLYCEMIA - a1c below 6 counseled  Medication management - prn loraze couple times per week  Adjustment disorder with anxiety - some rampin gup get back withcounselor for poss cbt.  HYPOTHYROIDISM  Fibromyalgia - per rheum stay on cymbalta  Tell neuro about the nubness tingling with cough that she described along with the leg numbness hx  Sinusitis resolving. -Patient advised to return or notify health care team  if symptoms worsen ,persist or new concerns arise.  Patient Instructions  Consider getting back with counselor   CBT to help with the anxiety .  Wellness visit  And cpx and hg a1c in 6 month .   Avoid simple sugars and carbs as discussed . Processed foods. Etc       Standley Brooking. Panosh M.D.  Pre visit review using our clinic review tool, if applicable. No additional management support is needed unless otherwise documented below in the visit note.

## 2013-05-10 ENCOUNTER — Encounter: Payer: Self-pay | Admitting: Internal Medicine

## 2013-10-23 ENCOUNTER — Other Ambulatory Visit: Payer: Medicaid Other

## 2013-10-29 ENCOUNTER — Telehealth: Payer: Self-pay | Admitting: Family Medicine

## 2013-10-29 ENCOUNTER — Encounter: Payer: Medicaid Other | Admitting: Internal Medicine

## 2013-10-29 NOTE — Telephone Encounter (Signed)
Patient missed her yearly wellness exam with Dr. Regis Bill.  Called her mobile and left a message for her to return my call.  Will need to reschedule if she returns my call.

## 2013-10-29 NOTE — Progress Notes (Signed)
Document opened and reviewed for cpx but appt  canceled same day .

## 2013-10-30 ENCOUNTER — Telehealth: Payer: Self-pay | Admitting: Internal Medicine

## 2013-10-30 NOTE — Telephone Encounter (Signed)
Pt requesting refill of levothyroxine (SYNTHROID, LEVOTHROID) 112 MCG table sent to [facility].

## 2013-10-31 MED ORDER — LEVOTHYROXINE SODIUM 112 MCG PO TABS: ORAL_TABLET | ORAL | Status: DC

## 2013-10-31 NOTE — Telephone Encounter (Signed)
Do not want the pt to run out of her medication.  #90 sent to the pharmacy.  She missed her CPX appt on 10/29/13.  Please help the pt to reschedule this appointment.  Thanks!

## 2013-12-21 ENCOUNTER — Encounter: Payer: Medicaid Other | Admitting: Internal Medicine

## 2014-01-02 ENCOUNTER — Telehealth: Payer: Self-pay | Admitting: Internal Medicine

## 2014-01-02 NOTE — Telephone Encounter (Signed)
Pt 's appt for cpx is scheduled for 01/21/14 @ 2:30, pt is out of medication and needs refills of the following until she comes in for appt:  DULoxetine (CYMBALTA) 60 MG capsule LORazepam (ATIVAN) 0.5 MG tablet  Pharmacy:  [address]

## 2014-01-03 MED ORDER — DULOXETINE HCL 60 MG PO CPEP: ORAL_CAPSULE | ORAL | Status: DC

## 2014-01-03 MED ORDER — LORAZEPAM 0.5 MG PO TABS: 0.5000 mg | ORAL_TABLET | Freq: Three times a day (TID) | ORAL | Status: DC | PRN

## 2014-01-03 NOTE — Telephone Encounter (Signed)
Cymbalta sent by e-scribe.  Lorazepam called to the pharmacy and left on machine.

## 2014-01-03 NOTE — Telephone Encounter (Signed)
Call in #30 of each

## 2014-01-21 ENCOUNTER — Encounter: Payer: Medicaid Other | Admitting: Internal Medicine

## 2014-01-21 ENCOUNTER — Telehealth: Payer: Self-pay | Admitting: Family Medicine

## 2014-01-21 NOTE — Telephone Encounter (Signed)
Second one no change but do not schedule a wellness visit   Just OV when possible.

## 2014-01-21 NOTE — Progress Notes (Signed)
Document opened and reviewed for CPX but appt  canceled same day . Pt had back pain.   Thi is teh second [place] canceled same day.  Do not schedule again without  Asking.

## 2014-01-21 NOTE — Telephone Encounter (Signed)
Pt missed her appointment today due to back pain and not being able to drive herself.  She left a message on my machine.  Do you want to charge for this missed visit?

## 2014-02-11 ENCOUNTER — Other Ambulatory Visit: Payer: Self-pay | Admitting: Internal Medicine

## 2014-02-12 NOTE — Telephone Encounter (Signed)
She canceld the last  cpx   She can have OV instead for med checks refill 1 month no more refills until OV

## 2014-02-13 ENCOUNTER — Telehealth: Payer: Self-pay | Admitting: Family Medicine

## 2014-02-13 NOTE — Telephone Encounter (Signed)
Pt needs a med check visit with Dr. Regis Bill in the next 30 days.  Please help the pt to get on the schedule.  I have refilled her Cymbalta for 30 days.  Thanks!

## 2014-02-13 NOTE — Telephone Encounter (Signed)
lmom for pt to cb

## 2014-02-13 NOTE — Telephone Encounter (Signed)
Sent to the pharmacy by e-scribe.  Will send a message to scheduling to help the pt make an appt.

## 2014-02-14 NOTE — Telephone Encounter (Signed)
Pt has been scheduled.  °

## 2014-02-18 ENCOUNTER — Ambulatory Visit (INDEPENDENT_AMBULATORY_CARE_PROVIDER_SITE_OTHER): Payer: Medicaid Other | Admitting: Internal Medicine

## 2014-02-18 ENCOUNTER — Encounter: Payer: Self-pay | Admitting: Internal Medicine

## 2014-02-18 VITALS — BP 124/80 | Temp 98.1°F | Ht 68.5 in | Wt 336.6 lb

## 2014-02-18 DIAGNOSIS — E039 Hypothyroidism, unspecified: Secondary | ICD-10-CM | POA: Insufficient documentation

## 2014-02-18 DIAGNOSIS — R202 Paresthesia of skin: Secondary | ICD-10-CM | POA: Insufficient documentation

## 2014-02-18 DIAGNOSIS — Z79899 Other long term (current) drug therapy: Secondary | ICD-10-CM

## 2014-02-18 DIAGNOSIS — R739 Hyperglycemia, unspecified: Secondary | ICD-10-CM

## 2014-02-18 DIAGNOSIS — Z23 Encounter for immunization: Secondary | ICD-10-CM

## 2014-02-18 DIAGNOSIS — F4322 Adjustment disorder with anxiety: Secondary | ICD-10-CM

## 2014-02-18 LAB — BASIC METABOLIC PANEL
BUN: 13 mg/dL (ref 6–23)
CO2: 28 mEq/L (ref 19–32)
Calcium: 9.3 mg/dL (ref 8.4–10.5)
Chloride: 101 mEq/L (ref 96–112)
Creatinine, Ser: 1.04 mg/dL (ref 0.40–1.20)
GFR: 61.14 mL/min (ref >60.00)
GLUCOSE: 123 mg/dL — AB (ref 70–99)
POTASSIUM: 4.2 meq/L (ref 3.5–5.1)
Sodium: 135 mEq/L (ref 135–145)

## 2014-02-18 LAB — LIPID PANEL
Cholesterol: 163 mg/dL (ref 0–200)
HDL: 34.7 mg/dL — AB (ref >39.00)
LDL CALC: 99 mg/dL (ref 0–99)
NONHDL: 128.3
TRIGLYCERIDES: 148 mg/dL (ref 0.0–149.0)
Total CHOL/HDL Ratio: 5
VLDL: 29.6 mg/dL (ref 0.0–40.0)

## 2014-02-18 LAB — CBC WITH DIFFERENTIAL/PLATELET
BASOS ABS: 0 10*3/uL (ref 0.0–0.1)
Basophils Relative: 0.4 % (ref 0.0–3.0)
EOS ABS: 0.3 10*3/uL (ref 0.0–0.7)
EOS PCT: 2.8 % (ref 0.0–5.0)
HCT: 40.3 % (ref 36.0–46.0)
Hemoglobin: 13.7 g/dL (ref 12.0–15.0)
LYMPHS ABS: 2.3 10*3/uL (ref 0.7–4.0)
Lymphocytes Relative: 25.5 % (ref 12.0–46.0)
MCHC: 34 g/dL (ref 30.0–36.0)
MCV: 88.5 fl (ref 78.0–100.0)
MONO ABS: 0.5 10*3/uL (ref 0.1–1.0)
Monocytes Relative: 5.4 % (ref 3.0–12.0)
NEUTROS ABS: 6 10*3/uL (ref 1.4–7.7)
Neutrophils Relative %: 65.9 % (ref 43.0–77.0)
Platelets: 229 10*3/uL (ref 150.0–400.0)
RBC: 4.56 Mil/uL (ref 3.87–5.11)
RDW: 13.8 % (ref 11.5–15.5)
WBC: 9.1 10*3/uL (ref 4.0–10.5)

## 2014-02-18 LAB — HEPATIC FUNCTION PANEL
ALT: 18 U/L (ref 0–35)
AST: 13 U/L (ref 0–37)
Albumin: 3.9 g/dL (ref 3.5–5.2)
Alkaline Phosphatase: 96 U/L (ref 39–117)
BILIRUBIN DIRECT: 0.1 mg/dL (ref 0.0–0.3)
BILIRUBIN TOTAL: 0.4 mg/dL (ref 0.2–1.2)
Total Protein: 7.5 g/dL (ref 6.0–8.3)

## 2014-02-18 LAB — HEMOGLOBIN A1C: Hgb A1c MFr Bld: 5.8 % (ref 4.6–6.5)

## 2014-02-18 LAB — TSH: TSH: 2.84 u[IU]/mL (ref 0.35–4.50)

## 2014-02-18 MED ORDER — DULOXETINE HCL 60 MG PO CPEP: 60.0000 mg | ORAL_CAPSULE | Freq: Every day | ORAL | Status: DC

## 2014-02-18 MED ORDER — LEVOTHYROXINE SODIUM 112 MCG PO TABS: ORAL_TABLET | ORAL | Status: DC

## 2014-02-18 NOTE — Progress Notes (Signed)
Pre visit review using our clinic review tool, if applicable. No additional management support is needed unless otherwise documented below in the visit note.  Chief Complaint  Patient presents with  . Follow-up    neck pain arms tingle  . Hypothyroidism  . Back Pain  . Depression    HPI: [name] 44 y.o. with hypothryoid anxiety  On meds DJD pain   Last visit last  May 04, 2022   cymbalta  60  Ran out.   Over couple days was over due to come in Back to pharmacy had wd symtpom  Had light theaded nausea   Helps for pain   advil for back .   Pain  Some days hard to  Move some better than other  Thinks the Cymbalta is working some   having mid neck pain and has at times    Sometimes   Arms tingling and  Come from neck bones.  ocass when holding thinks they drop? No falling    Holidays  Some anxiety   Some  Hibernating at times .    Counselor..   Has nt seen for a while has accessle .  [facility] counseling group.   Son doing well in school   hasn't had recently lab tests .  ROS: See pertinent positives and negatives per HPI. No cp sob  New  ocass use of ativan inalizing the divorce and legal proceedings   Past Medical History  Diagnosis Date  . Hypothyroid   . Anxiety     reactive   . Allergy   . History of nephrolithiasis 11/2008    under urology care uric acid rx ?  . Varicose veins     Family History  Problem Relation Age of Onset  . Breast cancer Mother     deceased May 04, 2006 05-04-2007  . ADD / ADHD Son   . Osteoarthritis Mother     History   Social History  . Marital Status: Married    Spouse Name: N/A    Number of Children: N/A  . Years of Education: N/A   Social History Main Topics  . Smoking status: Never Smoker   . Smokeless tobacco: Not on file  . Alcohol Use: No  . Drug Use: No  . Sexual Activity: Not on file   Other Topics Concern  . Not on file   Social History Narrative   Divorced .    Neg etoh tea.    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   Childbirth x 1   Mom and child in counseling doing well with support  Counseling     Father with  Hx os anger and other dx .   Divorce  June    Has BF supportive  and lives with son .                        Outpatient Encounter Prescriptions as of 02/18/2014  Medication Sig  . cetirizine (ZYRTEC) 10 MG tablet Take 10 mg by mouth daily.  . DULoxetine (CYMBALTA) 60 MG capsule Take 1 capsule (60 mg total) by mouth daily.  Marland Kitchen levothyroxine (SYNTHROID, LEVOTHROID) 112 MCG tablet TAKE ONE TABLET BY MOUTH ONCE DAILY  . LORazepam (ATIVAN) 0.5 MG tablet Take 1 tablet (0.5 mg total) by mouth every 8 (eight) hours as needed for anxiety.  . [  DISCONTINUED] DULoxetine (CYMBALTA) 60 MG capsule TAKE ONE CAPSULE BY MOUTH ONCE DAILY  . [DISCONTINUED] levothyroxine (SYNTHROID, LEVOTHROID) 112 MCG tablet TAKE ONE TABLET BY MOUTH ONCE DAILY  . [DISCONTINUED] amoxicillin-clavulanate (AUGMENTIN) 875-125 MG per tablet Take 1 tablet by mouth every 12 (twelve) hours. For sinusitis  . [DISCONTINUED] methocarbamol (ROBAXIN) 500 MG tablet Take 1 tablet (500 mg total) by mouth 3 (three) times daily as needed.    EXAM:  BP 124/80 mmHg  Temp(Src) 98.1 F (36.7 C) (Oral)  Ht 5' 8.5" (1.74 m)  Wt 336 lb 9.6 oz (152.681 kg)  BMI 50.43 kg/m2  Body mass index is 50.43 kg/(m^2).  GENERAL: vitals reviewed and listed above, alert, oriented, appears well hydrated and in no acute distress HEENT: atraumatic, conjunctiva  clear, no obvious abnormalities on inspection of external nose and ears OP : no lesion edema or exudate  NECK: no obvious masses on inspection palpation  LUNGS: clear to auscultation bilaterally, no wheezes, rales or rhonchi, good air movement CV: HRRR, no clubbing cyanosis or nl cap refill  MS: moves all extremities without noticeable focal  Abnormality Some discomfort prefers to stand  Neuro oriented x 3 no ob weakness   dtrs present in ue  No clonus  No obv atrophy  PSYCH: pleasant and cooperative, no obvious depression or anxiety   ASSESSMENT AND PLAN:  Discussed the following assessment and plan:  Hypothyroidism, unspecified hypothyroidism type - Plan: Basic metabolic panel, CBC with Differential/Platelet, Hemoglobin A1c, Hepatic function panel, TSH, Lipid panel  Tingling in extremities - arms with waht sounds like weakness vs pain . exam reassuring today refer  to hneuro could be cervical cause check metabolic today  - Plan: Ambulatory referral to Neurology  Medication management - Plan: Basic metabolic panel, CBC with Differential/Platelet, Hemoglobin A1c, Hepatic function panel, TSH, Lipid panel  Adjustment disorder with anxiety - ocass use of atiuvan as rescue  on cymbalta get back withcouseling during stress times - Plan: Basic metabolic panel, CBC with Differential/Platelet, Hemoglobin A1c, Hepatic function panel, TSH, Lipid panel  Hyperglycemia - Plan: Basic metabolic panel, CBC with Differential/Platelet, Hemoglobin A1c, Hepatic function panel, TSH, Lipid panel  Need for prophylactic vaccination and inoculation against influenza - Plan: Flu Vaccine QUAD 36+ mos PF IM (Fluarix Quad PF) Numbness.  Pain   Headaches  Light sensitivity .  Ate at about  noon  Hamburger. Tea sugar and water.  Blood work today  - Patient Instructions  Will notify you  of labs when available.  Continue  Medication.  Will arrange    Neurology .Marland Kitchen Referral about the  Arm  Numbness and neck pain.  Will refill medication at this time . Plan ROV   Depending on labs and how you are doing.  Cut out the sugar in your beverages.    Standley Brooking. Panosh M.D.

## 2014-02-18 NOTE — Patient Instructions (Signed)
Will notify you  of labs when available.  Continue  Medication.  Will arrange    Neurology .Marland Kitchen Referral about the  Arm  Numbness and neck pain.  Will refill medication at this time . Plan ROV   Depending on labs and how you are doing.  Cut out the sugar in your beverages.

## 2014-03-21 ENCOUNTER — Ambulatory Visit (INDEPENDENT_AMBULATORY_CARE_PROVIDER_SITE_OTHER): Payer: Medicaid Other | Admitting: Neurology

## 2014-03-21 ENCOUNTER — Encounter: Payer: Self-pay | Admitting: Neurology

## 2014-03-21 VITALS — BP 118/78 | HR 75 | Ht 68.5 in | Wt 336.4 lb

## 2014-03-21 DIAGNOSIS — M545 Low back pain, unspecified: Secondary | ICD-10-CM

## 2014-03-21 DIAGNOSIS — G5711 Meralgia paresthetica, right lower limb: Secondary | ICD-10-CM

## 2014-03-21 DIAGNOSIS — M5410 Radiculopathy, site unspecified: Secondary | ICD-10-CM

## 2014-03-21 NOTE — Progress Notes (Signed)
[name]   Date: 03/21/2014   [name] MRN: [id number] DOB: 05/09/1970   Dear Dr. Regis Bill:   Thank you for your kind referral of [name] for consultation of bilateral leg numbness/tingling. Although her history is well known to you, please allow [place] to reiterate it for the purpose of our medical record. The patient was accompanied to the clinic by self.    History of Present Illness: [name] is a 44 y.o. right-handed Caucasian female with history of kidney stones, hypothyroidism, anxiety, bilateral CTS release, fibromyalgia, and obesity presenting for evaluation of bilateral leg numbess.    Starting in 2013, she developed intermittent bilateral leg numbness when trying to sit down, standing, or turning.  Numbness involves her lower back, thighs, lower legs, and feet.  She has occasional low pain back associated with it.  No weakness, bowel or bladder symptoms.  She has not tried any NSAIDs or physical therapy. When she gets out of the bed in the morning, she has soreness and stiffness.  No prior imaging of the back has been done.  She also complains of right thigh numbness, involving the lateral thigh without involvement into the knee, posterior leg, or medial thigh.  This numbness is constant and present for several years.  She recalls having a nerve block in the same region around the same time.  She endorses having problems with weight gain over the years.    She also complains of bilateral arm weakness and dull, achy pain.  There is pain with palpation of the forearms.  Symptoms can last for hours to days and will limit her ability to lift things.  She has pain over the cervical region and base of the head.  She has been rheumatology at [facility] and was diagnosed with fibromyalgia for which she takes Cymbalta 60mg  daily.     Out-side paper records, electronic medical record, and  images have been reviewed where available and summarized as:  Lab Results  Component Value Date   TSH 2.84 02/18/2014   Lab Results  Component Value Date   HGBA1C 5.8 02/18/2014    Past Medical History  Diagnosis Date  . Hypothyroid   . Anxiety     reactive   . Allergy   . History of nephrolithiasis 11/2008    under urology care uric acid rx ?  . Varicose veins     Past Surgical History  Procedure Laterality Date  . Carpal tunnel release    . Rt knee surgery  08/1987  . Foot surgery    . Cholecystectomy    . Lithotripsy      twice by Dr. Gaynelle Arabian  . Teeth extracted      on top for decay     Medications:  Current Outpatient Prescriptions on File Prior to Hull  Medication Sig Dispense Refill  . cetirizine (ZYRTEC) 10 MG tablet Take 10 mg by mouth daily.    . DULoxetine (CYMBALTA) 60 MG capsule Take 1 capsule (60 mg total) by mouth daily. 90 capsule 3  . levothyroxine (SYNTHROID, LEVOTHROID) 112 MCG tablet TAKE ONE TABLET BY MOUTH ONCE DAILY 90 tablet 3  . LORazepam (ATIVAN) 0.5 MG tablet Take 1 tablet (0.5 mg total) by mouth every 8 (eight) hours as needed for anxiety. 30 tablet 0   No current facility-administered medications on file prior to Hull.    Allergies:  Allergies  Allergen Reactions  . Nalbuphine   .  Ondansetron     Family History: Family History  Problem Relation Age of Onset  . Breast cancer Mother     deceased May 25, 2007  . ADD / ADHD Son   . Osteoarthritis Mother   . Heart disease Father   . Pneumonia Father     Deceased, 70  . Healthy Sister     Social History: History   Social History  . Marital Status: Married    Spouse Name: N/A  . Number of Children: N/A  . Years of Education: N/A   Occupational History  . Not on file.   Social History Main Topics  . Smoking status: Never Smoker   . Smokeless tobacco: Not on file  . Alcohol Use: No  . Drug Use: No  . Sexual Activity: Not on file   Other Topics Concern  . Not on file     Social History Narrative   Neg etoh tea.    Independent   Condo doing better now   H H   2 son    She stopped working in 2013/05/24 and previously working at a daycare and doing Brewing technologist. She is not on disability or unemployment.   Childbirth x 1   Mom and child in counseling doing well with support  Counseling     Father with  Hx os anger and other dx .   Divorced in June 2014.   Has BF supportive  and lives with son .                        Review of Systems:  CONSTITUTIONAL: No fevers, chills, night sweats, or weight loss.   EYES: No visual changes or eye pain ENT: No hearing changes.  No history of nose bleeds.   RESPIRATORY: No cough, wheezing and shortness of breath.   CARDIOVASCULAR: Negative for chest pain, and palpitations.   GI: Negative for abdominal discomfort, blood in stools or black stools.  No recent change in bowel habits.   GU:  No history of incontinence.   MUSCLOSKELETAL: +history of joint pain or swelling.  +myalgias.   SKIN: Negative for lesions, rash, and itching.   HEMATOLOGY/ONCOLOGY: Negative for prolonged bleeding, bruising easily, and swollen nodes.  No history of cancer.   ENDOCRINE: Negative for cold or heat intolerance, polydipsia or goiter.   PSYCH:  +depression or anxiety symptoms.   NEURO: As Above.   Vital Signs:  BP 118/78 mmHg  Pulse 75  Ht 5' 8.5" (1.74 m)  Wt 336 lb 6 oz (152.579 kg)  BMI 50.40 kg/m2  SpO2 98%   General Medical Exam:   General:  Well appearing, comfortable.   Eyes/ENT: see cranial nerve examination.   Neck: No masses appreciated.  Tenderness to palpation of the cervical muscles.  No carotid bruits. Respiratory:  Clear to auscultation, good air entry bilaterally.   Cardiac:  Regular rate and rhythm, no murmur.   Extremities:  No deformities, edema, or skin discoloration.  Skin:  No rashes or lesions.  Neurological Exam: MENTAL STATUS including orientation to time, place, person, recent and remote  memory, attention span and concentration, language, and fund of knowledge is normal.  Speech is not dysarthric.  CRANIAL NERVES: II:  No visual field defects.  Unremarkable fundi.   III-IV-VI: Pupils equal round and reactive to light.  Normal conjugate, extra-ocular eye movements in all directions of gaze.  No nystagmus.  No ptosis.   V:  Normal facial sensation.  Jaw jerk is absent.   VII:  Normal facial symmetry and movements.  No pathologic facial reflexes.  VIII:  Normal hearing and vestibular function.   IX-X:  Normal palatal movement.   XI:  Normal shoulder shrug and head rotation.   XII:  Normal tongue strength and range of motion, no deviation or fasciculation.  MOTOR:  Marked tenderness over fibromyaglia tenderpoints.  No atrophy, fasciculations or abnormal movements.  No pronator drift.  Tone is normal.    Right Upper Extremity:    Left Upper Extremity:    Deltoid  5/5   Deltoid  5/5   Biceps  5/5   Biceps  5/5   Triceps  5/5   Triceps  5/5   Wrist extensors  5/5   Wrist extensors  5/5   Wrist flexors  5/5   Wrist flexors  5/5   Finger extensors  5/5   Finger extensors  5/5   Finger flexors  5/5   Finger flexors  5/5   Dorsal interossei  5/5   Dorsal interossei  5/5   Abductor pollicis  5/5   Abductor pollicis  5/5   Tone (Ashworth scale)  0  Tone (Ashworth scale)  0   Right Lower Extremity:    Left Lower Extremity:    Hip flexors  5/5   Hip flexors  5/5   Hip extensors  5/5   Hip extensors  5/5   Knee flexors  5/5   Knee flexors  5/5   Knee extensors  5/5   Knee extensors  5/5   Dorsiflexors  5/5   Dorsiflexors  5/5   Plantarflexors  5/5   Plantarflexors  5/5   Toe extensors  5/5   Toe extensors  5/5   Toe flexors  5/5   Toe flexors  5/5   Tone (Ashworth scale)  0  Tone (Ashworth scale)  0   MSRs:  Right                                                                 Left brachioradialis 2+  brachioradialis 2+  biceps 2+  biceps 2+  triceps 2+  triceps 2+    patellar 2+  patellar 2+  ankle jerk 2+  ankle jerk 2+  Hoffman no  Hoffman no  plantar response down  plantar response down   SENSORY:  Normal and symmetric perception of light touch, pinprick, vibration, and proprioception.  Romberg's sign absent.   COORDINATION/GAIT: Normal finger-to- nose-finger and heel-to-shin.  Intact rapid alternating movements bilaterally.  Able to rise from a chair without using arms.  Gait slightly wide-based due to body habitus and stable. She is able to stand on toes and heels.  Mild difficulty with tandem gait.    IMPRESSION: 1. Chronic low back pain with radicular pain involving the legs, most likely due to degenerative arthritis and stenosis, however with normal exam, these is most likely mild.  Given her progressive symptoms and no prior imaging, will order MRI lumbar spine to evaluate further.  She may benefit from PT going forward.    2.  Meralgia paresthetica affecting the right leg.  Based on history of exam, she has meralgia paresthetica an entrapment of the lateral femoral cutaneous nerve as it exits  below the inguinal canal. Management is conservative with NSAIDs and avoidance of repetitive trauma to this region as well as weight loss, which is the likely etiology in this case.   3.  Bilateral arm discomfort and pain is most consistent with fibromyaglia.     PLAN/RECOMMENDATIONS:  1.  MRI lumbar spine wo contrast 2.  Weight loss encouraged for right meralgia paresthetica 3.  Patient declined EMG 4.  Return to clinic in 3- months.   The duration of this appointment Hull was 45 minutes of face-to-face time with the patient.  Greater than 50% of this time was spent in counseling, explanation of diagnosis, planning of further management, and coordination of care.   Thank you for allowing me to participate in patient's care.  If I can answer any additional questions, I would be pleased to do so.    Sincerely,    Phallon Haydu K. Posey Pronto, DO

## 2014-03-21 NOTE — Patient Instructions (Signed)
1.  MRI lumbar spine 2.  Weight loss encouraged 3.  Return to clinic in 3 months

## 2014-04-09 ENCOUNTER — Telehealth: Payer: Self-pay | Admitting: *Deleted

## 2014-04-09 NOTE — Telephone Encounter (Signed)
janelle called to let us know that MRI lumbar spine was denied.

## 2014-04-09 NOTE — Telephone Encounter (Signed)
Noted.  Please notify patient and let's start physical therapy for her low back pain, as many times insurance companies want to be sure a cycle of PT has been done before they will approve imaging.  Raji Glinski K. Posey Pronto, DO

## 2014-04-09 NOTE — Telephone Encounter (Signed)
Patient notified MRI has been denied.  She agrees to go to PT.  Referral sent to [facility].

## 2014-04-10 ENCOUNTER — Inpatient Hospital Stay: Admission: RE | Admit: 2014-04-10 | Payer: Medicaid Other | Source: Ambulatory Visit

## 2014-06-04 ENCOUNTER — Other Ambulatory Visit: Payer: Self-pay | Admitting: Family Medicine

## 2014-06-04 NOTE — Telephone Encounter (Signed)
Ok to refill x 1  

## 2014-06-05 NOTE — Telephone Encounter (Signed)
Called to the pharmacy and left on machine. 

## 2014-06-21 ENCOUNTER — Ambulatory Visit: Payer: Medicaid Other | Admitting: Neurology

## 2014-07-05 ENCOUNTER — Ambulatory Visit: Payer: Medicaid Other | Admitting: Family Medicine

## 2014-07-05 ENCOUNTER — Telehealth: Payer: Self-pay | Admitting: Internal Medicine

## 2014-07-05 ENCOUNTER — Encounter (HOSPITAL_COMMUNITY): Payer: Self-pay | Admitting: *Deleted

## 2014-07-05 ENCOUNTER — Emergency Department (INDEPENDENT_AMBULATORY_CARE_PROVIDER_SITE_OTHER)
Admission: EM | Admit: 2014-07-05 | Discharge: 2014-07-05 | Disposition: A | Discharge status: Home / Self Care | Payer: Self-pay | Source: Home / Self Care | Attending: Family Medicine | Admitting: Family Medicine

## 2014-07-05 DIAGNOSIS — J0100 Acute maxillary sinusitis, unspecified: Secondary | ICD-10-CM

## 2014-07-05 DIAGNOSIS — G43009 Migraine without aura, not intractable, without status migrainosus: Secondary | ICD-10-CM

## 2014-07-05 DIAGNOSIS — J040 Acute laryngitis: Secondary | ICD-10-CM

## 2014-07-05 MED ORDER — PREDNISONE 10 MG PO TABS: 30.0000 mg | ORAL_TABLET | Freq: Every day | ORAL | Status: DC

## 2014-07-05 MED ORDER — HYDROCODONE-ACETAMINOPHEN 5-325 MG PO TABS: 0.5000 | ORAL_TABLET | Freq: Every evening | ORAL | Status: DC | PRN

## 2014-07-05 MED ORDER — AMOXICILLIN-POT CLAVULANATE 875-125 MG PO TABS: 1.0000 | ORAL_TABLET | Freq: Two times a day (BID) | ORAL | Status: DC

## 2014-07-05 MED ORDER — KETOROLAC TROMETHAMINE 60 MG/2ML IM SOLN
INTRAMUSCULAR | Status: AC
  Filled 2014-07-05: qty 2

## 2014-07-05 MED ORDER — DEXAMETHASONE SODIUM PHOSPHATE 10 MG/ML IJ SOLN
10.0000 mg | Freq: Once | INTRAMUSCULAR | Status: AC
  Administered 2014-07-05: 10 mg via INTRAMUSCULAR

## 2014-07-05 MED ORDER — KETOROLAC TROMETHAMINE 60 MG/2ML IM SOLN
60.0000 mg | Freq: Once | INTRAMUSCULAR | Status: AC
  Administered 2014-07-05: 60 mg via INTRAMUSCULAR

## 2014-07-05 MED ORDER — DEXAMETHASONE SODIUM PHOSPHATE 10 MG/ML IJ SOLN
INTRAMUSCULAR | Status: AC
  Filled 2014-07-05: qty 1

## 2014-07-05 MED ORDER — IPRATROPIUM BROMIDE 0.06 % NA SOLN
2.0000 | Freq: Four times a day (QID) | NASAL | Status: DC
Start: 2014-07-05 — End: 2016-01-08

## 2014-07-05 MED ORDER — METOCLOPRAMIDE HCL 5 MG/ML IJ SOLN
INTRAMUSCULAR | Status: AC
  Filled 2014-07-05: qty 2

## 2014-07-05 MED ORDER — METOCLOPRAMIDE HCL 5 MG/ML IJ SOLN
5.0000 mg | Freq: Once | INTRAMUSCULAR | Status: AC
  Administered 2014-07-05: 5 mg via INTRAMUSCULAR

## 2014-07-05 NOTE — Telephone Encounter (Signed)
Pt son's Charlie calling on behalf of pt due to pt being unable to talk.  Pt reports having laryngitis and unable to speak, she also reports having headache.  She has been taking over the counter medication for the last two weeks that has not helped.  Pt was scheduled to come in to office today but was did not due to her having [place] [facility].  Pt was advised to contact case worker regarding next steps for selecting a new PCP or having Medicaid changed from [place] if possible.  Pt requesting prescription for help with laryngitis.

## 2014-07-05 NOTE — ED Provider Notes (Signed)
[name] is a 44 y.o. female who presents to Urgent Care today for headache. Patient has a severe headache right-sided facial pain and pressure coarse voice and cough. Symptoms present for the last few days. Headache is consistent with previous episodes of migraines. She notes photophobia. No weakness or numbness or loss of function. She notes right-sided facial pain and pressure with postnasal drip as well. She notes a hoarse voice. Symptoms present for about a week. The cough is persistent and nonproductive. No chest pains or palpitations.   Past Medical History  Diagnosis Date  . Hypothyroid   . Anxiety     reactive   . Allergy   . History of nephrolithiasis 11/2008    under urology care uric acid rx ?  . Varicose veins    Past Surgical History  Procedure Laterality Date  . Carpal tunnel release    . Rt knee surgery  08/1987  . Foot surgery    . Cholecystectomy    . Lithotripsy      twice by Dr. Gaynelle Arabian  . Teeth extracted      on top for decay   History  Substance Use Topics  . Smoking status: Never Smoker   . Smokeless tobacco: Not on file  . Alcohol Use: No   ROS as above Medications: No current facility-administered medications for this encounter.   Current Outpatient Prescriptions  Medication Sig Dispense Refill  . amoxicillin-clavulanate (AUGMENTIN) 875-125 MG per tablet Take 1 tablet by mouth every 12 (twelve) hours. 14 tablet 0  . cetirizine (ZYRTEC) 10 MG tablet Take 10 mg by mouth daily.    . DULoxetine (CYMBALTA) 60 MG capsule Take 1 capsule (60 mg total) by mouth daily. 90 capsule 3  . HYDROcodone-acetaminophen (NORCO/VICODIN) 5-325 MG per tablet Take 0.5 tablets by mouth at bedtime as needed (cough). 6 tablet 0  . ipratropium (ATROVENT) 0.06 % nasal spray Place 2 sprays into both nostrils 4 (four) times daily. 15 mL 1  . levothyroxine (SYNTHROID, LEVOTHROID) 112 MCG tablet TAKE ONE TABLET BY MOUTH ONCE DAILY 90 tablet 3  . LORazepam (ATIVAN) 0.5 MG  tablet TAKE ONE TABLET BY MOUTH EVERY 8 HOURS AS NEEDED FOR ANXIETY 30 tablet 0  . predniSONE (DELTASONE) 10 MG tablet Take 3 tablets (30 mg total) by mouth daily. 15 tablet 0   Allergies  Allergen Reactions  . Nalbuphine   . Ondansetron      Exam:  BP 143/93 mmHg  Pulse 64  Temp(Src) 98.3 F (36.8 C) (Oral)  Resp 16  SpO2 100% Gen: Well NAD HEENT: EOMI,  MMM posterior pharynx with cobblestoning. Normal tympanic membranes bilaterally. Tender palpation right maxillary sinus. Clear nasal discharge. PERRLA bilaterally Lungs: Normal work of breathing. CTABL hoarse voice Heart: RRR no MRG Abd: NABS, Soft. Nondistended, Nontender Exts: Brisk capillary refill, warm and well perfused.   Patient was given 60 mg IM Toradol, 10 mg IM dexamethasone, and 5 mg IM Reglan. She felt better  No results found for this or any previous visit (from the past 24 hour(s)). No results found.  Assessment and Plan: 44 y.o. female with sinusitis, laryngitis and migraine headache. Treat with medications as above, Atrovent nasal spray prednisone Augmentin and hydrocodone for cough suppression. Return as needed. Work note provided.  Discussed warning signs or symptoms. Please see discharge instructions. Patient expresses understanding.     Gregor Hams, MD 07/05/14 [phone]

## 2014-07-05 NOTE — Discharge Instructions (Signed)
Thank you for coming in today. °Call or go to the emergency room if you get worse, have trouble breathing, have chest pains, or palpitations.  ° °Sinusitis °Sinusitis is redness, soreness, and inflammation of the paranasal sinuses. Paranasal sinuses are air pockets within the bones of your face (beneath the eyes, the middle of the forehead, or above the eyes). In healthy paranasal sinuses, mucus is able to drain out, and air is able to circulate through them by way of your nose. However, when your paranasal sinuses are inflamed, mucus and air can become trapped. This can allow bacteria and other germs to grow and cause infection. °Sinusitis can develop quickly and last only a short time (acute) or continue over a long period (chronic). Sinusitis that lasts for more than 12 weeks is considered chronic.  °CAUSES  °Causes of sinusitis include: °· Allergies. °· Structural abnormalities, such as displacement of the cartilage that separates your nostrils (deviated septum), which can decrease the air flow through your nose and sinuses and affect sinus drainage. °· Functional abnormalities, such as when the small hairs (cilia) that line your sinuses and help remove mucus do not work properly or are not present. °SIGNS AND SYMPTOMS  °Symptoms of acute and chronic sinusitis are the same. The primary symptoms are pain and pressure around the affected sinuses. Other symptoms include: °· Upper toothache. °· Earache. °· Headache. °· Bad breath. °· Decreased sense of smell and taste. °· A cough, which worsens when you are lying flat. °· Fatigue. °· Fever. °· Thick drainage from your nose, which often is green and may contain pus (purulent). °· Swelling and warmth over the affected sinuses. °DIAGNOSIS  °Your health care provider will perform a physical exam. During the exam, your health care provider may: °· Look in your nose for signs of abnormal growths in your nostrils (nasal polyps). °· Tap over the affected sinus to check for  signs of infection. °· View the inside of your sinuses (endoscopy) using an imaging device that has a light attached (endoscope). °If your health care provider suspects that you have chronic sinusitis, one or more of the following tests may be recommended: °· Allergy tests. °· Nasal culture. A sample of mucus is taken from your nose, sent to a lab, and screened for bacteria. °· Nasal cytology. A sample of mucus is taken from your nose and examined by your health care provider to determine if your sinusitis is related to an allergy. °TREATMENT  °Most cases of acute sinusitis are related to a viral infection and will resolve on their own within 10 days. Sometimes medicines are prescribed to help relieve symptoms (pain medicine, decongestants, nasal steroid sprays, or saline sprays).  °However, for sinusitis related to a bacterial infection, your health care provider will prescribe antibiotic medicines. These are medicines that will help kill the bacteria causing the infection.  °Rarely, sinusitis is caused by a fungal infection. In theses cases, your health care provider will prescribe antifungal medicine. °For some cases of chronic sinusitis, surgery is needed. Generally, these are cases in which sinusitis recurs more than 3 times per year, despite other treatments. °HOME CARE INSTRUCTIONS  °· Drink plenty of water. Water helps thin the mucus so your sinuses can drain more easily. °· Use a humidifier. °· Inhale steam 3 to 4 times a day (for example, sit in the bathroom with the shower running). °· Apply a warm, moist washcloth to your face 3 to 4 times a day, or as directed by your   health care provider.  Use saline nasal sprays to help moisten and clean your sinuses.  Take medicines only as directed by your health care provider.  If you were prescribed either an antibiotic or antifungal medicine, finish it all even if you start to feel better. SEEK IMMEDIATE MEDICAL CARE IF:  You have increasing pain or  severe headaches.  You have nausea, vomiting, or drowsiness.  You have swelling around your face.  You have vision problems.  You have a stiff neck.  You have difficulty breathing. MAKE SURE YOU:   Understand these instructions.  Will watch your condition.  Will get help right away if you are not doing well or get worse. Document Released: 01/11/2005 Document Revised: 05/28/2013 Document Reviewed: 01/26/2011 [facility] Patient Information 2015 [place], [place]. This information is not intended to replace advice given to you by your health care provider. Make sure you discuss any questions you have with your health care provider.  Laryngitis At the top of your windpipe is your voice box. It is the source of your voice. Inside your voice box are 2 bands of muscles called vocal cords. When you breathe, your vocal cords are relaxed and open so that air can get into the lungs. When you decide to say something, these cords come together and vibrate. The sound from these vibrations goes into your throat and comes out through your mouth as sound. Laryngitis is an inflammation of the vocal cords that causes hoarseness, cough, loss of voice, sore throat, and dry throat. Laryngitis can be temporary (acute) or long-term (chronic). Most cases of acute laryngitis improve with time.Chronic laryngitis lasts for more than 3 weeks. CAUSES Laryngitis can often be related to excessive smoking, talking, or yelling, as well as inhalation of toxic fumes and allergies. Acute laryngitis is usually caused by a viral infection, vocal strain, measles or mumps, or bacterial infections. Chronic laryngitis is usually caused by vocal cord strain, vocal cord injury, postnasal drip, growths on the vocal cords, or acid reflux. SYMPTOMS   Cough.  Sore throat.  Dry throat. RISK FACTORS  Respiratory infections.  Exposure to irritating substances, such as cigarette smoke, excessive amounts of alcohol, stomach acids, and  workplace chemicals.  Voice trauma, such as vocal cord injury from shouting or speaking too loud. DIAGNOSIS  Your cargiver will perform a physical exam. During the physical exam, your caregiver will examine your throat. The most common sign of laryngitis is hoarseness. Laryngoscopy may be necessary to confirm the diagnosis of this condition. This procedure allows your caregiver to look into the larynx. HOME CARE INSTRUCTIONS  Drink enough fluids to keep your urine clear or pale yellow.  Rest until you no longer have symptoms or as directed by your caregiver.  Breathe in moist air.  Take all medicine as directed by your caregiver.  Do not smoke.  Talk as little as possible (this includes whispering).  Write on paper instead of talking until your voice is back to normal.  Follow up with your caregiver if your condition has not improved after 10 days. SEEK MEDICAL CARE IF:   You have trouble breathing.  You cough up blood.  You have persistent fever.  You have increasing pain.  You have difficulty swallowing. MAKE SURE YOU:  Understand these instructions.  Will watch your condition.  Will get help right away if you are not doing well or get worse. Document Released: 01/11/2005 Document Revised: 04/05/2011 Document Reviewed: 03/19/2010 [facility] Patient Information 2015 [place], [place]. This information is  not intended to replace advice given to you by your health care provider. Make sure you discuss any questions you have with your health care provider.   Migraine Headache A migraine headache is an intense, throbbing pain on one or both sides of your head. A migraine can last for 30 minutes to several hours. CAUSES  The exact cause of a migraine headache is not always known. However, a migraine may be caused when nerves in the brain become irritated and release chemicals that cause inflammation. This causes pain. Certain things may also trigger migraines, such  as:  Alcohol.  Smoking.  Stress.  Menstruation.  Aged cheeses.  Foods or drinks that contain nitrates, glutamate, aspartame, or tyramine.  Lack of sleep.  Chocolate.  Caffeine.  Hunger.  Physical exertion.  Fatigue.  Medicines used to treat chest pain (nitroglycerine), birth control pills, estrogen, and some blood pressure medicines. SIGNS AND SYMPTOMS  Pain on one or both sides of your head.  Pulsating or throbbing pain.  Severe pain that prevents daily activities.  Pain that is aggravated by any physical activity.  Nausea, vomiting, or both.  Dizziness.  Pain with exposure to bright lights, loud noises, or activity.  General sensitivity to bright lights, loud noises, or smells. Before you get a migraine, you may get warning signs that a migraine is coming (aura). An aura may include:  Seeing flashing lights.  Seeing bright spots, halos, or zigzag lines.  Having tunnel vision or blurred vision.  Having feelings of numbness or tingling.  Having trouble talking.  Having muscle weakness. DIAGNOSIS  A migraine headache is often diagnosed based on:  Symptoms.  Physical exam.  A CT scan or MRI of your head. These imaging tests cannot diagnose migraines, but they can help rule out other causes of headaches. TREATMENT Medicines may be given for pain and nausea. Medicines can also be given to help prevent recurrent migraines.  HOME CARE INSTRUCTIONS  Only take over-the-counter or prescription medicines for pain or discomfort as directed by your health care provider. The use of long-term narcotics is not recommended.  Lie down in a dark, quiet room when you have a migraine.  Keep a journal to find out what may trigger your migraine headaches. For example, write down:  What you eat and drink.  How much sleep you get.  Any change to your diet or medicines.  Limit alcohol consumption.  Quit smoking if you smoke.  Get 7-9 hours of sleep, or as  recommended by your health care provider.  Limit stress.  Keep lights dim if bright lights bother you and make your migraines worse. SEEK IMMEDIATE MEDICAL CARE IF:   Your migraine becomes severe.  You have a fever.  You have a stiff neck.  You have vision loss.  You have muscular weakness or loss of muscle control.  You start losing your balance or have trouble walking.  You feel faint or pass out.  You have severe symptoms that are different from your first symptoms. MAKE SURE YOU:   Understand these instructions.  Will watch your condition.  Will get help right away if you are not doing well or get worse. Document Released: 01/11/2005 Document Revised: 05/28/2013 Document Reviewed: 09/18/2012 [facility] Patient Information 2015 [place], [place]. This information is not intended to replace advice given to you by your health care provider. Make sure you discuss any questions you have with your health care provider.

## 2014-07-05 NOTE — Telephone Encounter (Signed)
I was not aware that patient was told this earlier.  However, I did return call to patient and advised her Dr. Regis Bill is not able to call her in any medication without her being seen for evaluation.  I again advised pt we do not participate with [place] and she would need to speak with her case worker to locate new PCP or have Medicaid plan changed if they would allow her.  Pt states she was told by her case worker that her Medicaid plan had been changed from [place] but she agreed to reach back out to her case worker for resolution.  I advised pt to seek treatment at an urgent care facility in the meantime.

## 2014-07-05 NOTE — ED Notes (Signed)
Pt is here with complaints of 3 week history of sinus pressure and headache with persistent cough and laryngitis.

## 2014-07-05 NOTE — Telephone Encounter (Signed)
Pt was informed by Dorna Bloom (earlier conversation)  that no antibiotics will be called to the pharmacy.  She will need to be seen by a physician.

## 2014-08-08 ENCOUNTER — Ambulatory Visit: Payer: Medicaid Other | Admitting: Neurology

## 2014-08-31 ENCOUNTER — Other Ambulatory Visit: Payer: Self-pay | Admitting: Internal Medicine

## 2015-03-25 ENCOUNTER — Emergency Department (HOSPITAL_BASED_OUTPATIENT_CLINIC_OR_DEPARTMENT_OTHER): Payer: Medicaid Other

## 2015-03-25 ENCOUNTER — Other Ambulatory Visit: Payer: Self-pay

## 2015-03-25 ENCOUNTER — Encounter (HOSPITAL_BASED_OUTPATIENT_CLINIC_OR_DEPARTMENT_OTHER): Payer: Self-pay | Admitting: *Deleted

## 2015-03-25 ENCOUNTER — Emergency Department (HOSPITAL_BASED_OUTPATIENT_CLINIC_OR_DEPARTMENT_OTHER)
Admission: EM | Admit: 2015-03-25 | Discharge: 2015-03-26 | Disposition: A | Discharge status: Home / Self Care | Payer: Medicaid Other | Attending: Emergency Medicine | Admitting: Emergency Medicine

## 2015-03-25 DIAGNOSIS — R079 Chest pain, unspecified: Secondary | ICD-10-CM | POA: Diagnosis present

## 2015-03-25 DIAGNOSIS — F419 Anxiety disorder, unspecified: Secondary | ICD-10-CM | POA: Diagnosis not present

## 2015-03-25 DIAGNOSIS — E039 Hypothyroidism, unspecified: Secondary | ICD-10-CM | POA: Insufficient documentation

## 2015-03-25 DIAGNOSIS — Z7982 Long term (current) use of aspirin: Secondary | ICD-10-CM | POA: Insufficient documentation

## 2015-03-25 DIAGNOSIS — F410 Panic disorder [episodic paroxysmal anxiety] without agoraphobia: Secondary | ICD-10-CM

## 2015-03-25 DIAGNOSIS — Z79899 Other long term (current) drug therapy: Secondary | ICD-10-CM | POA: Insufficient documentation

## 2015-03-25 DIAGNOSIS — R0789 Other chest pain: Secondary | ICD-10-CM | POA: Diagnosis not present

## 2015-03-25 DIAGNOSIS — Z8679 Personal history of other diseases of the circulatory system: Secondary | ICD-10-CM | POA: Diagnosis not present

## 2015-03-25 DIAGNOSIS — Z87442 Personal history of urinary calculi: Secondary | ICD-10-CM | POA: Diagnosis not present

## 2015-03-25 LAB — TROPONIN I: Troponin I: <0.03 ng/mL (ref <0.031)

## 2015-03-25 NOTE — ED Notes (Signed)
Tightness in her chest on the way here to bring her for evaluation. States has a hx of panic attacks. She feels lightheaded. Headache x 5 days.

## 2015-03-26 NOTE — Discharge Instructions (Signed)
Your x-ray, and cardiac testing are normal.  Return here as needed.  Follow-up with your primary care doctor

## 2015-03-26 NOTE — ED Provider Notes (Signed)
CSN: [id number]     Arrival date & time 03/25/15  05-16-10 History   First MD Initiated Contact with Patient 03/25/15 May 15, 2233     Chief Complaint  Patient presents with  . Chest Pain     (Consider location/radiation/quality/duration/timing/severity/associated sxs/prior Treatment) HPI Patient presents to the emergency department with chest pain that started when she saw the amount of people in the waiting room when she was bringing her son to be seen for an upper respiratory illness.  Patient states she got very anxious and upset at the number of people in the waiting room.  She states she has had a bad experience here previously and all of the people made her very upset.  She states that her chest felt heaviness but is since subsided, states she feels less anxious and upset and therefore her chest feels better.  Patient denies shortness of breath, nausea, vomiting, weakness, dizziness, headache, blurred vision, back pain, neck pain, fever, cough, dysuria, incontinence, abdominal pain, near syncope or syncope.  Patient did not take any medications for her symptoms Past Medical History  Diagnosis Date  . Hypothyroid   . Anxiety     reactive   . Allergy   . History of nephrolithiasis 11/2008    under urology care uric acid rx ?  . Varicose veins    Past Surgical History  Procedure Laterality Date  . Carpal tunnel release    . Rt knee surgery  08/1987  . Foot surgery    . Cholecystectomy    . Lithotripsy      twice by Dr. Gaynelle Arabian  . Teeth extracted      on top for decay   Family History  Problem Relation Age of Onset  . Breast cancer Mother     deceased May 16, 2007  . ADD / ADHD Son   . Osteoarthritis Mother   . Heart disease Father   . Pneumonia Father     Deceased, 19  . Healthy Sister    Social History  Substance Use Topics  . Smoking status: Never Smoker   . Smokeless tobacco: None  . Alcohol Use: No   OB History    No data available     Review of Systems All other systems  negative except as documented in the HPI. All pertinent positives and negatives as reviewed in the HPI.   Allergies  Nalbuphine and Ondansetron  Home Medications   Prior to Admission medications   Medication Sig Start Date End Date Taking? Authorizing Provider  amoxicillin-clavulanate (AUGMENTIN) 875-125 MG per tablet Take 1 tablet by mouth every 12 (twelve) hours. 07/05/14   Gregor Hams, MD  cetirizine (ZYRTEC) 10 MG tablet Take 10 mg by mouth daily.    Historical Provider, MD  DULoxetine (CYMBALTA) 60 MG capsule Take 1 capsule (60 mg total) by mouth daily. 02/18/14   Burnis Medin, MD  HYDROcodone-acetaminophen (NORCO/VICODIN) 5-325 MG per tablet Take 0.5 tablets by mouth at bedtime as needed (cough). 07/05/14   Gregor Hams, MD  ipratropium (ATROVENT) 0.06 % nasal spray Place 2 sprays into both nostrils 4 (four) times daily. 07/05/14   Gregor Hams, MD  levothyroxine (SYNTHROID, LEVOTHROID) 112 MCG tablet TAKE ONE TABLET BY MOUTH ONCE DAILY 02/18/14   Burnis Medin, MD  LORazepam (ATIVAN) 0.5 MG tablet TAKE ONE TABLET BY MOUTH EVERY 8 HOURS AS NEEDED FOR ANXIETY 06/05/14   Burnis Medin, MD  predniSONE (DELTASONE) 10 MG tablet Take 3 tablets (30 mg total) by  mouth daily. 07/05/14   Gregor Hams, MD   BP 120/75 mmHg  Pulse 74  Temp(Src) 98.8 F (37.1 C) (Oral)  Resp 22  Ht 5\' 9"  (1.753 m)  Wt 152.409 kg  BMI 49.60 kg/m2  SpO2 100%  LMP 03/18/2015 Physical Exam  Constitutional: She is oriented to person, place, and time. She appears well-developed and well-nourished. No distress.  HENT:  Head: Normocephalic and atraumatic.  Mouth/Throat: Oropharynx is clear and moist.  Eyes: Pupils are equal, round, and reactive to light.  Neck: Normal range of motion. Neck supple.  Cardiovascular: Normal rate, regular rhythm and normal heart sounds.  Exam reveals no gallop and no friction rub.   No murmur heard. Pulmonary/Chest: Effort normal and breath sounds normal. No respiratory distress. She  has no wheezes.  Abdominal: Soft. Bowel sounds are normal. She exhibits no distension. There is no tenderness.  Neurological: She is alert and oriented to person, place, and time. She exhibits normal muscle tone. Coordination normal.  Skin: Skin is warm and dry. No rash noted. No erythema.  Psychiatric: She has a normal mood and affect. Her behavior is normal.  Nursing note and vitals reviewed.   ED Course  Procedures (including critical care time) Labs Review Labs Reviewed  TROPONIN I    Imaging Review Dg Chest 2 View  03/25/2015  CLINICAL DATA:  45 year old female with chest pain and lightheadedness EXAM: CHEST  2 VIEW COMPARISON:  None. FINDINGS: The heart size and mediastinal contours are within normal limits. Both lungs are clear. The visualized skeletal structures are unremarkable. IMPRESSION: No active cardiopulmonary disease. Electronically Signed   By: Anner Crete M.D.   On: 03/25/2015 23:56   I have personally reviewed and evaluated these images and lab results as part of my medical decision-making.   EKG Interpretation   Date/Time:  Tuesday March 25 2015 18:21:41 EST Ventricular Rate:  75 PR Interval:  164 QRS Duration: 98 QT Interval:  384 QTC Calculation: 428 R Axis:   51 Text Interpretation:  Normal sinus rhythm Low voltage QRS Possible Lateral  infarct , age undetermined Inferior infarct , age undetermined Abnormal  ECG No significant change since last tracing Confirmed by LITTLE MD,  RACHEL [phone]) on 03/25/2015 8:18:41 PM      The patient.  The sequelae had an anxiety reaction based on her history of present illness and physical exam findings.  Patient is advised return here as needed.  Told to follow-up with her primary care doctor patient's testing here today was normal.  This seems atypical and more along the lines of an anxiety type reaction   Dalia Heading, PA-C 03/29/15 [place], MD 03/31/15 [phone]

## 2015-05-12 ENCOUNTER — Other Ambulatory Visit: Payer: Self-pay | Admitting: Internal Medicine

## 2015-05-13 ENCOUNTER — Telehealth: Payer: Self-pay | Admitting: Internal Medicine

## 2015-05-13 NOTE — Telephone Encounter (Signed)
Sent to the pharmacy for 1 month.  Pt notified.

## 2015-05-13 NOTE — Telephone Encounter (Signed)
Agree she sholud not run out of these meds  Can refill  Each x 1 month  . Call back  If needed

## 2015-05-13 NOTE — Telephone Encounter (Signed)
Pt has had to switch to [employer]) and cannot see Dr Regis Bill anymore.  Pt has not seen Dr Regis Bill since 01/2014.  Pt states she cannot see her new doctor until she gets her new medicaid card. Pt has her old medicaid card, and a letter stating she has been  approved another year, but they will not accept. However, pt states she has no more refills on her  levothyroxine (SYNTHROID, LEVOTHROID) 112 MCG tablet DULoxetine (CYMBALTA) 60 MG capsule  And states she cannot go without these meds. Pt would like to know if Dr Regis Bill will refill for her?   [facility]/ [address]

## 2015-05-13 NOTE — Telephone Encounter (Signed)
Spoke to pt. Pt unable to schedule an appointment with her new doctor until she has her new medicaid card in hand. Pt does not know when she will get this card, pt has attempted numerous times to contact her case worker. Pt is very concerned and states she really needs to stay on these meds.

## 2015-05-13 NOTE — Telephone Encounter (Signed)
Sent to the pharmacy by e-scribe. 

## 2015-05-13 NOTE — Telephone Encounter (Signed)
Need to know when the pt will see her new physician so Dr. Regis Bill will know how long to fill the levothyroxine if she approves it.

## 2015-06-13 ENCOUNTER — Other Ambulatory Visit: Payer: Self-pay | Admitting: Internal Medicine

## 2015-06-13 NOTE — Telephone Encounter (Signed)
Pt is requesting a refill. Last filled on 05/13/15. Last OV 02/18/14. Okay to refill?

## 2015-06-16 NOTE — Telephone Encounter (Signed)
Tried reaching the pt by telephone.  Received a message that the voicemail was not available.  Will try again at a later time.

## 2015-06-16 NOTE — Telephone Encounter (Signed)
Has she gotten new insurance yet  And  New PCP? ( see last refill message note) she needs to have lab monitoring overdue . If she has  Medicaid I can see her but if [place] cannot see her .   Ok to refill one more time  Until she gets in  Either way

## 2015-06-18 ENCOUNTER — Other Ambulatory Visit: Payer: Self-pay

## 2015-06-18 MED ORDER — DULOXETINE HCL 60 MG PO CPEP: ORAL_CAPSULE | ORAL | Status: AC

## 2015-06-18 MED ORDER — LEVOTHYROXINE SODIUM 112 MCG PO TABS: 112.0000 ug | ORAL_TABLET | Freq: Every day | ORAL | Status: DC

## 2015-06-19 NOTE — Telephone Encounter (Signed)
Pt states that she has found a new PCP

## 2015-08-06 ENCOUNTER — Other Ambulatory Visit: Payer: Self-pay | Admitting: Physician Assistant

## 2015-08-06 DIAGNOSIS — Z1231 Encounter for screening mammogram for malignant neoplasm of breast: Secondary | ICD-10-CM

## 2015-08-13 ENCOUNTER — Ambulatory Visit
Admission: RE | Admit: 2015-08-13 | Discharge: 2015-08-13 | Disposition: A | Discharge status: Home / Self Care | Payer: Medicaid Other | Source: Ambulatory Visit | Attending: Physician Assistant | Admitting: Physician Assistant

## 2015-08-13 DIAGNOSIS — Z1231 Encounter for screening mammogram for malignant neoplasm of breast: Secondary | ICD-10-CM

## 2016-01-08 ENCOUNTER — Emergency Department (HOSPITAL_COMMUNITY)
Admission: EM | Admit: 2016-01-08 | Discharge: 2016-01-09 | Disposition: A | Discharge status: Home / Self Care | Payer: Medicaid Other | Attending: Emergency Medicine | Admitting: Emergency Medicine

## 2016-01-08 ENCOUNTER — Emergency Department (HOSPITAL_COMMUNITY): Payer: Medicaid Other

## 2016-01-08 ENCOUNTER — Encounter (HOSPITAL_COMMUNITY): Payer: Self-pay | Admitting: Emergency Medicine

## 2016-01-08 DIAGNOSIS — Y92219 Unspecified school as the place of occurrence of the external cause: Secondary | ICD-10-CM | POA: Diagnosis not present

## 2016-01-08 DIAGNOSIS — Y999 Unspecified external cause status: Secondary | ICD-10-CM | POA: Diagnosis not present

## 2016-01-08 DIAGNOSIS — Y9389 Activity, other specified: Secondary | ICD-10-CM | POA: Diagnosis not present

## 2016-01-08 DIAGNOSIS — Z79899 Other long term (current) drug therapy: Secondary | ICD-10-CM | POA: Insufficient documentation

## 2016-01-08 DIAGNOSIS — S6991XA Unspecified injury of right wrist, hand and finger(s), initial encounter: Secondary | ICD-10-CM | POA: Diagnosis present

## 2016-01-08 DIAGNOSIS — E039 Hypothyroidism, unspecified: Secondary | ICD-10-CM | POA: Insufficient documentation

## 2016-01-08 DIAGNOSIS — R55 Syncope and collapse: Secondary | ICD-10-CM | POA: Insufficient documentation

## 2016-01-08 DIAGNOSIS — W108XXA Fall (on) (from) other stairs and steps, initial encounter: Secondary | ICD-10-CM | POA: Diagnosis not present

## 2016-01-08 DIAGNOSIS — S62111A Displaced fracture of triquetrum [cuneiform] bone, right wrist, initial encounter for closed fracture: Secondary | ICD-10-CM | POA: Insufficient documentation

## 2016-01-08 LAB — I-STAT BETA HCG BLOOD, ED (MC, WL, AP ONLY): I-stat hCG, quantitative: <5 m[IU]/mL (ref <5)

## 2016-01-08 LAB — CBC WITH DIFFERENTIAL/PLATELET
Basophils Absolute: 0.1 10*3/uL (ref 0.0–0.1)
Basophils Relative: 1 %
EOS ABS: 0.3 10*3/uL (ref 0.0–0.7)
Eosinophils Relative: 4 %
HCT: 38.1 % (ref 36.0–46.0)
HEMOGLOBIN: 13 g/dL (ref 12.0–15.0)
LYMPHS ABS: 2.2 10*3/uL (ref 0.7–4.0)
LYMPHS PCT: 29 %
MCH: 30 pg (ref 26.0–34.0)
MCHC: 34.1 g/dL (ref 30.0–36.0)
MCV: 88 fL (ref 78.0–100.0)
MONOS PCT: 5 %
Monocytes Absolute: 0.4 10*3/uL (ref 0.1–1.0)
NEUTROS PCT: 61 %
Neutro Abs: 4.6 10*3/uL (ref 1.7–7.7)
Platelets: 223 10*3/uL (ref 150–400)
RBC: 4.33 MIL/uL (ref 3.87–5.11)
RDW: 13 % (ref 11.5–15.5)
WBC: 7.5 10*3/uL (ref 4.0–10.5)

## 2016-01-08 LAB — CBG MONITORING, ED: GLUCOSE-CAPILLARY: 131 mg/dL — AB (ref 65–99)

## 2016-01-08 LAB — I-STAT TROPONIN, ED: TROPONIN I, POC: 0 ng/mL (ref 0.00–0.08)

## 2016-01-08 MED ORDER — FENTANYL CITRATE (PF) 100 MCG/2ML IJ SOLN: 50.0000 ug | Freq: Once | INTRAMUSCULAR | Status: DC

## 2016-01-08 NOTE — ED Notes (Signed)
Refusing BP.

## 2016-01-08 NOTE — ED Notes (Signed)
Pt refused IV  Pain medicine. RN aware

## 2016-01-08 NOTE — ED Notes (Signed)
Transported to CT/xray

## 2016-01-08 NOTE — ED Notes (Signed)
Pt requesting to go home.

## 2016-01-08 NOTE — ED Triage Notes (Signed)
Pt had syncopal episode falling down three steps and waking up on her right side at the bottom.  Pt experienced dizziness prior to fall.  Pt c/o neck and back pain, right shoulder and arm pain, chronic with acute ontop.  Pt alert and oriented.  Pt has hx of fibromyalgia, migraines.  No cardiac history.  Pt takes Cymbalta, gabapentin and synthroid.
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
## 2016-01-09 NOTE — ED Notes (Signed)
Pt states "I am not going to [facility] for a long time."

## 2016-01-09 NOTE — ED Notes (Signed)
Pt refuses IV.   

## 2016-01-09 NOTE — Discharge Instructions (Signed)
Get help right away if: You have a severe headache. You have unusual pain in your chest, abdomen, or back. You are bleeding from your mouth or rectum, or you have black or tarry stool. You have a very fast or irregular heartbeat (palpitations). You have pain with breathing. You faint once or repeatedly. You have a seizure. You are confused. You have trouble walking. You have severe weakness. You have vision problems.  Get help right away if: Your skin or fingers on your injured arm turn blue or gray. Your arm feels cold or gets numb. You have severe pain in your injured wrist.

## 2016-01-11 ENCOUNTER — Encounter (HOSPITAL_COMMUNITY): Payer: Self-pay | Admitting: Emergency Medicine

## 2016-01-11 ENCOUNTER — Emergency Department (HOSPITAL_COMMUNITY)
Admission: EM | Admit: 2016-01-11 | Discharge: 2016-01-11 | Disposition: A | Discharge status: Home / Self Care | Payer: Medicaid Other | Attending: Emergency Medicine | Admitting: Emergency Medicine

## 2016-01-11 DIAGNOSIS — W19XXXD Unspecified fall, subsequent encounter: Secondary | ICD-10-CM | POA: Diagnosis not present

## 2016-01-11 DIAGNOSIS — E039 Hypothyroidism, unspecified: Secondary | ICD-10-CM | POA: Diagnosis not present

## 2016-01-11 DIAGNOSIS — Z4789 Encounter for other orthopedic aftercare: Secondary | ICD-10-CM | POA: Diagnosis present

## 2016-01-11 DIAGNOSIS — S62111D Displaced fracture of triquetrum [cuneiform] bone, right wrist, subsequent encounter for fracture with routine healing: Secondary | ICD-10-CM | POA: Diagnosis not present

## 2016-01-11 DIAGNOSIS — M7989 Other specified soft tissue disorders: Secondary | ICD-10-CM | POA: Insufficient documentation

## 2016-01-11 DIAGNOSIS — Z79899 Other long term (current) drug therapy: Secondary | ICD-10-CM | POA: Insufficient documentation

## 2016-01-11 DIAGNOSIS — S62101D Fracture of unspecified carpal bone, right wrist, subsequent encounter for fracture with routine healing: Secondary | ICD-10-CM
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
## 2016-01-11 NOTE — ED Triage Notes (Signed)
Pt had right arm splint and now having some swelling and now splint is tight and causing increased pain; pt here to have checked; CMS intact

## 2016-01-11 NOTE — Progress Notes (Signed)
Orthopedic Tech Progress Note Patient Details:  [name] [birth year] [id number]  Ortho Devices Type of Ortho Device: Ace wrap, Volar splint Ortho Device/Splint Interventions: Application   [name] 01/11/2016, 11:53 AM

## 2016-01-11 NOTE — ED Notes (Signed)
Declined W/C at D/C and was escorted to lobby by RN. 

## 2016-01-11 NOTE — Discharge Instructions (Signed)
Your splint was changed today since it was uncomfortable. Wear wrist splint at all times until you see the hand specialist. Ice and elevate wrist throughout the day, using ice pack for no more than 20 minutes every hour.  Use home pain medications for pain relief, including tylenol or motrin. If you develop tingling/numbness in the hand, loosen the ace wrap and reapply it slightly less tightly. Make sure the entire forearm and hand are evenly covered by the ace wrap. Swelling is normal as part of your injury, elevating and icing it will help with that. Follow up with your hand specialist as directed by your prior ER visit. Return to the ER for changes or worsening symptoms.

## 2016-01-11 NOTE — ED Notes (Signed)
Ortho tech Richards saw patient.

## 2016-08-30 ENCOUNTER — Ambulatory Visit: Payer: Self-pay | Admitting: General Surgery

## 2016-09-08 ENCOUNTER — Encounter (HOSPITAL_COMMUNITY): Payer: Self-pay | Admitting: Emergency Medicine

## 2016-09-08 DIAGNOSIS — Z23 Encounter for immunization: Secondary | ICD-10-CM | POA: Insufficient documentation

## 2016-09-08 DIAGNOSIS — S0502XA Injury of conjunctiva and corneal abrasion without foreign body, left eye, initial encounter: Secondary | ICD-10-CM | POA: Insufficient documentation

## 2016-09-08 DIAGNOSIS — Y998 Other external cause status: Secondary | ICD-10-CM | POA: Insufficient documentation

## 2016-09-08 DIAGNOSIS — E039 Hypothyroidism, unspecified: Secondary | ICD-10-CM | POA: Diagnosis not present

## 2016-09-08 DIAGNOSIS — W5582XA Struck by other mammals, initial encounter: Secondary | ICD-10-CM | POA: Diagnosis not present

## 2016-09-08 DIAGNOSIS — Y9389 Activity, other specified: Secondary | ICD-10-CM | POA: Diagnosis not present

## 2016-09-08 DIAGNOSIS — Y929 Unspecified place or not applicable: Secondary | ICD-10-CM | POA: Diagnosis not present

## 2016-09-08 DIAGNOSIS — Z79899 Other long term (current) drug therapy: Secondary | ICD-10-CM | POA: Diagnosis not present

## 2016-09-08 DIAGNOSIS — F419 Anxiety disorder, unspecified: Secondary | ICD-10-CM | POA: Insufficient documentation

## 2016-09-08 DIAGNOSIS — Z9049 Acquired absence of other specified parts of digestive tract: Secondary | ICD-10-CM | POA: Diagnosis not present

## 2016-09-08 DIAGNOSIS — S0590XA Unspecified injury of unspecified eye and orbit, initial encounter: Secondary | ICD-10-CM | POA: Diagnosis present

## 2016-09-08 MED ORDER — FLUORESCEIN SODIUM 0.6 MG OP STRP
1.0000 | ORAL_STRIP | Freq: Once | OPHTHALMIC | Status: AC
  Administered 2016-09-09: 1 via OPHTHALMIC
  Filled 2016-09-08: qty 1

## 2016-09-08 MED ORDER — TETRACAINE HCL 0.5 % OP SOLN
2.0000 [drp] | Freq: Once | OPHTHALMIC | Status: AC
  Administered 2016-09-09: 2 [drp] via OPHTHALMIC
  Filled 2016-09-08: qty 4

## 2016-09-08 NOTE — ED Triage Notes (Signed)
Pt states she was pecked in L eye by a chicken was sitting on L shoulder. Pt experiencing severe pain with photosensitivity.

## 2016-09-09 ENCOUNTER — Emergency Department (HOSPITAL_COMMUNITY)
Admission: EM | Admit: 2016-09-09 | Discharge: 2016-09-09 | Disposition: A | Discharge status: Home / Self Care | Payer: Medicaid Other | Attending: Emergency Medicine | Admitting: Emergency Medicine

## 2016-09-09 DIAGNOSIS — S0502XA Injury of conjunctiva and corneal abrasion without foreign body, left eye, initial encounter: Secondary | ICD-10-CM

## 2016-09-09 MED ORDER — ERYTHROMYCIN 5 MG/GM OP OINT
1.0000 "application " | TOPICAL_OINTMENT | Freq: Once | OPHTHALMIC | Status: AC
  Administered 2016-09-09: 1 via OPHTHALMIC
  Filled 2016-09-09: qty 3.5

## 2016-09-09 MED ORDER — TETANUS-DIPHTH-ACELL PERTUSSIS 5-2.5-18.5 LF-MCG/0.5 IM SUSP
0.5000 mL | Freq: Once | INTRAMUSCULAR | Status: AC
  Administered 2016-09-09: 0.5 mL via INTRAMUSCULAR
  Filled 2016-09-09: qty 0.5

## 2016-09-09 NOTE — ED Notes (Signed)
Update given to family, apologized for delay.
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
## 2016-09-09 NOTE — Discharge Instructions (Signed)
Place 1/2 inch ribbon of erythromycin ointment in your left eye 4 times a day for 1 week. Use tylenol or ibuprofen for pain. Follow up with your primary care doctor to ensure resolution of symptoms. Follow up with Dr. Alanda Slim and/or return to the ED if pain or vision worsens

## 2016-09-17 ENCOUNTER — Encounter (HOSPITAL_COMMUNITY): Payer: Self-pay

## 2016-09-17 ENCOUNTER — Encounter (HOSPITAL_COMMUNITY)
Admission: RE | Admit: 2016-09-17 | Discharge: 2016-09-17 | Disposition: A | Discharge status: Home / Self Care | Payer: Medicaid Other | Source: Ambulatory Visit | Attending: General Surgery | Admitting: General Surgery

## 2016-09-17 DIAGNOSIS — Z01818 Encounter for other preprocedural examination: Secondary | ICD-10-CM | POA: Insufficient documentation

## 2016-09-17 HISTORY — DX: Gastro-esophageal reflux disease without esophagitis: K21.9

## 2016-09-17 HISTORY — DX: Other specified postprocedural states: Z98.890

## 2016-09-17 HISTORY — DX: Ventral hernia without obstruction or gangrene: K43.9

## 2016-09-17 HISTORY — DX: Personal history of urinary calculi: Z87.442

## 2016-09-17 HISTORY — DX: Depression, unspecified: F32.A

## 2016-09-17 HISTORY — DX: Nausea with vomiting, unspecified: R11.2

## 2016-09-17 HISTORY — DX: Fibromyalgia: M79.7

## 2016-09-17 HISTORY — DX: Major depressive disorder, single episode, unspecified: F32.9

## 2016-09-17 LAB — BASIC METABOLIC PANEL
Anion gap: 9 (ref 5–15)
BUN: 11 mg/dL (ref 6–20)
CALCIUM: 8.8 mg/dL — AB (ref 8.9–10.3)
CO2: 27 mmol/L (ref 22–32)
CREATININE: 0.96 mg/dL (ref 0.44–1.00)
Chloride: 102 mmol/L (ref 101–111)
GFR calc Af Amer: >60 mL/min (ref >60)
GLUCOSE: 112 mg/dL — AB (ref 65–99)
POTASSIUM: 3.5 mmol/L (ref 3.5–5.1)
Sodium: 138 mmol/L (ref 135–145)

## 2016-09-17 LAB — CBC
HEMATOCRIT: 39.9 % (ref 36.0–46.0)
Hemoglobin: 13.5 g/dL (ref 12.0–15.0)
MCH: 29.3 pg (ref 26.0–34.0)
MCHC: 33.8 g/dL (ref 30.0–36.0)
MCV: 86.6 fL (ref 78.0–100.0)
Platelets: 214 10*3/uL (ref 150–400)
RBC: 4.61 MIL/uL (ref 3.87–5.11)
RDW: 13.5 % (ref 11.5–15.5)
WBC: 7.5 10*3/uL (ref 4.0–10.5)

## 2016-09-17 LAB — HCG, SERUM, QUALITATIVE: Preg, Serum: NEGATIVE

## 2016-09-17 NOTE — Progress Notes (Signed)
[facility]  Cardiologist - Denies  Chest x-ray - 01/08/16  EKG - 01/08/16  Stress Test - Denies  ECHO - Denies  Cardiac Cath - Denies  Sleep Study - Denies CPAP - None   Pt denies having chest pain, sob, or fever at this time. All instructions explained to the pt, with a verbal understanding of the material. Pt agrees to go over the instructions while at home for a better understanding. The opportunity to ask questions was provided.

## 2016-09-17 NOTE — Progress Notes (Signed)
[name]            09/17/2016                          [id number] [name], [place] [address] Phone: [phone] Fax: [phone]  [place] ([place]), [place] - [address] [place] [address] Phone: [phone] Fax: [phone]              Your procedure is scheduled on Friday, September 24, 2016            Report to [facility] Admitting Entrance "A" at 6:45 A.M.             Call this number if you have problems the morning of surgery:            [phone]             Remember:            Do not eat food or drink liquids after midnight on September 23, 2016            Take these medicines the morning of surgery with A SIP OF WATER: Cimetidine (TAGAMET), DULoxetine (CYMBALTA), Gabapentin (NEURONTIN), and Levothyroxine (SYNTHROID, LEVOTHROID).  7 days before surgery stop taking all Aspirins, Vitamins, Fish oils, and Herbal medications. Also stop all NSAIDS i.e. Advil, Motrin, Aleve, Anaprox, Naproxen, BC and Goody Powders.  Please complete your 8oz of Boost Breeze that was given to you at your preadmission appointment by 4:45 A.M. on the day of your surgery.             Do not wear jewelry, make-up or nail polish.            Do not wear lotions, powders, or perfumes, or deodorant.            Do not shave 48 hours prior to surgery.            Do not bring valuables to the hospital.            [facility] is not responsible for any belongings or valuables.  Contacts, dentures or bridgework may not be worn into surgery.  Leave your suitcase in the car.  After surgery it may be brought to your room.  For patients admitted to the hospital, discharge time will be determined by your treatment team.  Patients discharged the day of surgery will not be allowed to drive home.   Special instructions:   [facility]- Preparing For Surgery  Before  surgery, you can play an important role. Because skin is not sterile, your skin needs to be as free of germs as possible. You can reduce the number of germs on your skin by washing with CHG (chlorahexidine gluconate) Soap before surgery.  CHG is an antiseptic cleaner which kills germs and bonds with the skin to continue killing germs even after washing.  Please do not use if you have an allergy to CHG or antibacterial soaps. If your skin becomes reddened/irritated stop using the CHG.  Do not shave (including legs and underarms) for at least 48 hours prior to first CHG shower. It is OK to shave your face.  Please follow these instructions carefully.  1. Shower the NIGHT BEFORE SURGERY and the MORNING OF SURGERY with CHG.   2. If you chose to wash your hair, wash your hair first as usual with your normal shampoo.  3. After you shampoo, rinse your hair and body thoroughly to remove the shampoo.  4. Use CHG as you would any other liquid soap. You can apply CHG directly to the skin and wash gently with a scrungie or a clean washcloth.   5. Apply the CHG Soap to your body ONLY FROM THE NECK DOWN.  Do not use on open wounds or open sores. Avoid contact with your eyes, ears, mouth and genitals (private parts). Wash genitals (private parts) with your normal soap.  6. Wash thoroughly, paying special attention to the area where your surgery will be performed.  7. Thoroughly rinse your body with warm water from the neck down.  8. DO NOT shower/wash with your normal soap after using and rinsing off the CHG Soap.  9. Pat yourself dry with a CLEAN TOWEL.   10. Wear CLEAN PAJAMAS   11. Place CLEAN SHEETS on your bed the night of your first shower and DO NOT SLEEP WITH PETS.  Day of Surgery: Do not apply any deodorants/lotions. Please wear clean clothes to the hospital/surgery  center.    Please read over the following fact sheets that you were given. Pain Booklet, Coughing and Deep Breathing and Surgical Site Infection Prevention

## 2016-09-17 NOTE — Pre-Procedure Instructions (Signed)
[name]  09/17/2016      [id number] [name], [name] [address] Phone: [phone] Fax: [phone]  [name], [name] Phone: [phone] Fax: [phone]    Your procedure is scheduled on Friday, September 24, 2016  Report to [facility] Admitting Entrance "A" at 6:45 A.M.   Call this number if you have problems the morning of surgery:  [phone]   Remember:  Do not eat food or drink liquids after midnight on September 23, 2016  Take these medicines the morning of surgery with A SIP OF WATER: Cimetidine (TAGAMET), DULoxetine (CYMBALTA), and Levothyroxine (SYNTHROID, LEVOTHROID).  7 days before surgery stop taking all Aspirins, Vitamins, Fish oils, and Herbal medications. Also stop all NSAIDS i.e. Advil, Motrin, Aleve, Anaprox, Naproxen, BC and Goody Powders.  Please complete your 8oz of Boost Breeze that was given to you at your preadmission appointment by 4:45 A.M. on the day of your surgery.   Do not wear jewelry, make-up or nail polish.  Do not wear lotions, powders, or perfumes, or deodorant.  Do not shave 48 hours prior to surgery.  Do not bring valuables to the hospital.  [facility] is not responsible for any belongings or valuables.  Contacts, dentures or bridgework may not be worn into surgery.  Leave your suitcase in the car.  After surgery it may be brought to your room.  For patients admitted to the hospital, discharge time will be determined by your treatment team.  Patients discharged the day of surgery will not be allowed to drive home.   Special instructions:   [name]  Before surgery, you can play an important role. Because skin is not sterile, your skin needs to be as free of germs as possible. You can reduce the number of germs on your skin by washing  with CHG (chlorahexidine gluconate) Soap before surgery.  CHG is an antiseptic cleaner which kills germs and bonds with the skin to continue killing germs even after washing.  Please do not use if you have an allergy to CHG or antibacterial soaps. If your skin becomes reddened/irritated stop using the CHG.  Do not shave (including legs and underarms) for at least 48 hours prior to first CHG shower. It is OK to shave your face.  Please follow these instructions carefully.   1. Shower the NIGHT BEFORE SURGERY and the MORNING OF SURGERY with CHG.   2. If you chose to wash your hair, wash your hair first as usual with your normal shampoo.  3. After you shampoo, rinse your hair and body thoroughly to remove the shampoo.  4. Use CHG as you would any other liquid soap. You can apply CHG directly to the skin and wash gently with a scrungie or a clean washcloth.   5. Apply the CHG Soap to your body ONLY FROM THE NECK DOWN.  Do not use on open wounds or open sores. Avoid contact with your eyes, ears, mouth and genitals (private parts). Wash genitals (private parts) with your normal soap.  6. Wash thoroughly, paying special attention to the area where your surgery will be performed.  7. Thoroughly rinse your body with warm water from the neck down.  8. DO NOT shower/wash with your normal soap after using and rinsing off the CHG Soap.  9. Pat yourself  dry with a CLEAN TOWEL.   10. Wear CLEAN PAJAMAS   11. Place CLEAN SHEETS on your bed the night of your first shower and DO NOT SLEEP WITH PETS.  Day of Surgery: Do not apply any deodorants/lotions. Please wear clean clothes to the hospital/surgery center.    Please read over the following fact sheets that you were given. Pain Booklet, Coughing and Deep Breathing and Surgical Site Infection Prevention

## 2016-09-23 MED ORDER — DEXTROSE 5 % IV SOLN
3.0000 g | INTRAVENOUS | Status: AC
  Administered 2016-09-24: 3 g via INTRAVENOUS
  Filled 2016-09-23: qty 3000

## 2016-09-24 ENCOUNTER — Ambulatory Visit (HOSPITAL_COMMUNITY): Payer: Medicaid Other | Admitting: Certified Registered Nurse Anesthetist

## 2016-09-24 ENCOUNTER — Ambulatory Visit (HOSPITAL_COMMUNITY)
Admission: RE | Admit: 2016-09-24 | Discharge: 2016-09-25 | Disposition: A | Discharge status: Home / Self Care | Payer: Medicaid Other | Source: Ambulatory Visit | Attending: General Surgery | Admitting: General Surgery

## 2016-09-24 ENCOUNTER — Encounter (HOSPITAL_COMMUNITY): Payer: Self-pay

## 2016-09-24 ENCOUNTER — Encounter (HOSPITAL_COMMUNITY)
Admission: RE | Disposition: A | Discharge status: Home / Self Care | Payer: Self-pay | Source: Ambulatory Visit | Attending: General Surgery

## 2016-09-24 DIAGNOSIS — Z7982 Long term (current) use of aspirin: Secondary | ICD-10-CM | POA: Insufficient documentation

## 2016-09-24 DIAGNOSIS — Z8719 Personal history of other diseases of the digestive system: Secondary | ICD-10-CM

## 2016-09-24 DIAGNOSIS — F329 Major depressive disorder, single episode, unspecified: Secondary | ICD-10-CM | POA: Insufficient documentation

## 2016-09-24 DIAGNOSIS — K439 Ventral hernia without obstruction or gangrene: Secondary | ICD-10-CM | POA: Diagnosis present

## 2016-09-24 DIAGNOSIS — K5900 Constipation, unspecified: Secondary | ICD-10-CM | POA: Diagnosis not present

## 2016-09-24 DIAGNOSIS — G43909 Migraine, unspecified, not intractable, without status migrainosus: Secondary | ICD-10-CM | POA: Diagnosis not present

## 2016-09-24 DIAGNOSIS — Z79899 Other long term (current) drug therapy: Secondary | ICD-10-CM | POA: Diagnosis not present

## 2016-09-24 DIAGNOSIS — E039 Hypothyroidism, unspecified: Secondary | ICD-10-CM | POA: Insufficient documentation

## 2016-09-24 DIAGNOSIS — Z9889 Other specified postprocedural states: Secondary | ICD-10-CM

## 2016-09-24 DIAGNOSIS — F419 Anxiety disorder, unspecified: Secondary | ICD-10-CM | POA: Insufficient documentation

## 2016-09-24 DIAGNOSIS — Z6841 Body Mass Index (BMI) 40.0 and over, adult: Secondary | ICD-10-CM | POA: Insufficient documentation

## 2016-09-24 DIAGNOSIS — K219 Gastro-esophageal reflux disease without esophagitis: Secondary | ICD-10-CM | POA: Diagnosis not present

## 2016-09-24 HISTORY — PX: INSERTION OF MESH: SHX5868

## 2016-09-24 HISTORY — PX: VENTRAL HERNIA REPAIR: SHX424

## 2016-09-24 SURGERY — REPAIR, HERNIA, VENTRAL
Anesthesia: General | Site: Abdomen

## 2016-09-24 MED ORDER — PROCHLORPERAZINE MALEATE 10 MG PO TABS
10.0000 mg | ORAL_TABLET | Freq: Four times a day (QID) | ORAL | Status: DC | PRN
  Filled 2016-09-24: qty 1

## 2016-09-24 MED ORDER — SUMATRIPTAN SUCCINATE 100 MG PO TABS: 100.0000 mg | ORAL_TABLET | ORAL | Status: DC | PRN

## 2016-09-24 MED ORDER — BUPIVACAINE-EPINEPHRINE (PF) 0.25% -1:200000 IJ SOLN
INTRAMUSCULAR | Status: DC | PRN
  Administered 2016-09-24: 10 mL

## 2016-09-24 MED ORDER — DULOXETINE HCL 60 MG PO CPEP
60.0000 mg | ORAL_CAPSULE | Freq: Two times a day (BID) | ORAL | Status: DC
  Administered 2016-09-24: 60 mg via ORAL
  Filled 2016-09-24: qty 1

## 2016-09-24 MED ORDER — SUGAMMADEX SODIUM 500 MG/5ML IV SOLN
INTRAVENOUS | Status: AC
  Filled 2016-09-24: qty 5

## 2016-09-24 MED ORDER — HEPARIN SODIUM (PORCINE) 5000 UNIT/ML IJ SOLN
5000.0000 [IU] | Freq: Three times a day (TID) | INTRAMUSCULAR | Status: DC
  Administered 2016-09-25: 5000 [IU] via SUBCUTANEOUS
  Filled 2016-09-24: qty 1

## 2016-09-24 MED ORDER — MIDAZOLAM HCL 2 MG/2ML IJ SOLN
INTRAMUSCULAR | Status: AC
  Filled 2016-09-24: qty 2

## 2016-09-24 MED ORDER — LACTATED RINGERS IV SOLN
INTRAVENOUS | Status: DC
  Administered 2016-09-24: 07:00:00 via INTRAVENOUS

## 2016-09-24 MED ORDER — FENTANYL CITRATE (PF) 100 MCG/2ML IJ SOLN
25.0000 ug | INTRAMUSCULAR | Status: DC | PRN
  Administered 2016-09-24 (×2): 50 ug via INTRAVENOUS

## 2016-09-24 MED ORDER — PROCHLORPERAZINE EDISYLATE 5 MG/ML IJ SOLN: 5.0000 mg | Freq: Four times a day (QID) | INTRAMUSCULAR | Status: DC | PRN

## 2016-09-24 MED ORDER — ACETAMINOPHEN 500 MG PO TABS
1000.0000 mg | ORAL_TABLET | ORAL | Status: AC
  Administered 2016-09-24: 1000 mg via ORAL
  Filled 2016-09-24: qty 2

## 2016-09-24 MED ORDER — SUGAMMADEX SODIUM 200 MG/2ML IV SOLN
INTRAVENOUS | Status: AC
  Filled 2016-09-24: qty 2

## 2016-09-24 MED ORDER — CELECOXIB 200 MG PO CAPS
400.0000 mg | ORAL_CAPSULE | ORAL | Status: AC
  Administered 2016-09-24: 400 mg via ORAL
  Filled 2016-09-24: qty 2

## 2016-09-24 MED ORDER — LACTATED RINGERS IV SOLN: INTRAVENOUS | Status: DC

## 2016-09-24 MED ORDER — SUGAMMADEX SODIUM 200 MG/2ML IV SOLN
INTRAVENOUS | Status: DC | PRN
  Administered 2016-09-24: 300 mg via INTRAVENOUS

## 2016-09-24 MED ORDER — METOCLOPRAMIDE HCL 5 MG/ML IJ SOLN: 10.0000 mg | Freq: Once | INTRAMUSCULAR | Status: DC | PRN

## 2016-09-24 MED ORDER — LEVOTHYROXINE SODIUM 137 MCG PO TABS: 137.0000 ug | ORAL_TABLET | Freq: Every day | ORAL | Status: DC

## 2016-09-24 MED ORDER — FENTANYL CITRATE (PF) 100 MCG/2ML IJ SOLN: 25.0000 ug | INTRAMUSCULAR | Status: DC | PRN

## 2016-09-24 MED ORDER — POTASSIUM CHLORIDE IN NACL 20-0.9 MEQ/L-% IV SOLN: INTRAVENOUS | Status: DC

## 2016-09-24 MED ORDER — PANTOPRAZOLE SODIUM 40 MG IV SOLR: 40.0000 mg | Freq: Every day | INTRAVENOUS | Status: DC

## 2016-09-24 MED ORDER — OXYCODONE HCL 5 MG PO TABS
5.0000 mg | ORAL_TABLET | ORAL | Status: DC | PRN
  Administered 2016-09-24: 10 mg via ORAL
  Filled 2016-09-24: qty 2

## 2016-09-24 MED ORDER — CHLORHEXIDINE GLUCONATE CLOTH 2 % EX PADS: 6.0000 | MEDICATED_PAD | Freq: Once | CUTANEOUS | Status: DC

## 2016-09-24 MED ORDER — PANTOPRAZOLE SODIUM 40 MG IV SOLR
40.0000 mg | Freq: Every day | INTRAVENOUS | Status: DC
  Administered 2016-09-24: 40 mg via INTRAVENOUS
  Filled 2016-09-24: qty 40

## 2016-09-24 MED ORDER — FENTANYL CITRATE (PF) 250 MCG/5ML IJ SOLN
INTRAMUSCULAR | Status: AC
  Filled 2016-09-24: qty 5

## 2016-09-24 MED ORDER — METOCLOPRAMIDE HCL 5 MG/ML IJ SOLN
INTRAMUSCULAR | Status: DC | PRN
  Administered 2016-09-24: 10 mg via INTRAVENOUS

## 2016-09-24 MED ORDER — FENTANYL CITRATE (PF) 100 MCG/2ML IJ SOLN
INTRAMUSCULAR | Status: AC
Start: 2016-09-24 — End: 2016-09-24
  Administered 2016-09-24: 50 ug via INTRAVENOUS
  Filled 2016-09-24: qty 2

## 2016-09-24 MED ORDER — BUPIVACAINE-EPINEPHRINE (PF) 0.25% -1:200000 IJ SOLN
INTRAMUSCULAR | Status: AC
  Filled 2016-09-24: qty 30

## 2016-09-24 MED ORDER — DEXAMETHASONE SODIUM PHOSPHATE 10 MG/ML IJ SOLN
INTRAMUSCULAR | Status: AC
  Filled 2016-09-24: qty 1

## 2016-09-24 MED ORDER — METHOCARBAMOL 500 MG PO TABS
500.0000 mg | ORAL_TABLET | Freq: Four times a day (QID) | ORAL | Status: DC | PRN
  Administered 2016-09-24: 500 mg via ORAL
  Filled 2016-09-24: qty 1

## 2016-09-24 MED ORDER — MEPERIDINE HCL 25 MG/ML IJ SOLN: 6.2500 mg | INTRAMUSCULAR | Status: DC | PRN

## 2016-09-24 MED ORDER — PROPOFOL 10 MG/ML IV BOLUS
INTRAVENOUS | Status: AC
  Filled 2016-09-24: qty 20

## 2016-09-24 MED ORDER — PROPOFOL 10 MG/ML IV BOLUS
INTRAVENOUS | Status: DC | PRN
  Administered 2016-09-24: 200 mg via INTRAVENOUS

## 2016-09-24 MED ORDER — MORPHINE SULFATE (PF) 4 MG/ML IV SOLN: 1.0000 mg | INTRAVENOUS | Status: DC | PRN

## 2016-09-24 MED ORDER — ROCURONIUM BROMIDE 10 MG/ML (PF) SYRINGE
PREFILLED_SYRINGE | INTRAVENOUS | Status: DC | PRN
  Administered 2016-09-24: 60 mg via INTRAVENOUS
  Administered 2016-09-24: 10 mg via INTRAVENOUS

## 2016-09-24 MED ORDER — POTASSIUM CHLORIDE IN NACL 20-0.9 MEQ/L-% IV SOLN
INTRAVENOUS | Status: DC
  Administered 2016-09-24: 14:00:00 via INTRAVENOUS
  Filled 2016-09-24: qty 1000

## 2016-09-24 MED ORDER — LORATADINE 10 MG PO TABS: 10.0000 mg | ORAL_TABLET | Freq: Every day | ORAL | Status: DC

## 2016-09-24 MED ORDER — LIDOCAINE 2% (20 MG/ML) 5 ML SYRINGE
INTRAMUSCULAR | Status: AC
  Filled 2016-09-24: qty 5

## 2016-09-24 MED ORDER — POVIDONE-IODINE 10 % EX OINT
TOPICAL_OINTMENT | CUTANEOUS | Status: AC
  Filled 2016-09-24: qty 28.35

## 2016-09-24 MED ORDER — HEPARIN SODIUM (PORCINE) 5000 UNIT/ML IJ SOLN: 5000.0000 [IU] | Freq: Three times a day (TID) | INTRAMUSCULAR | Status: DC

## 2016-09-24 MED ORDER — LEVOTHYROXINE SODIUM 25 MCG PO TABS
137.0000 ug | ORAL_TABLET | Freq: Every day | ORAL | Status: DC
  Administered 2016-09-25: 137 ug via ORAL
  Filled 2016-09-24: qty 1

## 2016-09-24 MED ORDER — LIDOCAINE 2% (20 MG/ML) 5 ML SYRINGE
INTRAMUSCULAR | Status: DC | PRN
  Administered 2016-09-24: 100 mg via INTRAVENOUS

## 2016-09-24 MED ORDER — HYDROCODONE-ACETAMINOPHEN 5-325 MG PO TABS: 1.0000 | ORAL_TABLET | ORAL | 0 refills | Status: DC | PRN

## 2016-09-24 MED ORDER — 0.9 % SODIUM CHLORIDE (POUR BTL) OPTIME
TOPICAL | Status: DC | PRN
  Administered 2016-09-24: 1000 mL

## 2016-09-24 MED ORDER — MIDAZOLAM HCL 2 MG/2ML IJ SOLN
INTRAMUSCULAR | Status: DC | PRN
  Administered 2016-09-24: 2 mg via INTRAVENOUS

## 2016-09-24 MED ORDER — OXYCODONE HCL 5 MG PO TABS: 5.0000 mg | ORAL_TABLET | ORAL | Status: DC | PRN

## 2016-09-24 MED ORDER — GABAPENTIN 600 MG PO TABS: 600.0000 mg | ORAL_TABLET | Freq: Three times a day (TID) | ORAL | Status: DC

## 2016-09-24 MED ORDER — ONDANSETRON HCL 4 MG/2ML IJ SOLN
INTRAMUSCULAR | Status: AC
  Filled 2016-09-24: qty 2

## 2016-09-24 MED ORDER — METHOCARBAMOL 500 MG PO TABS
500.0000 mg | ORAL_TABLET | Freq: Four times a day (QID) | ORAL | Status: DC | PRN
Start: 2016-09-24 — End: 2016-09-24

## 2016-09-24 MED ORDER — EPHEDRINE SULFATE-NACL 50-0.9 MG/10ML-% IV SOSY
PREFILLED_SYRINGE | INTRAVENOUS | Status: DC | PRN
  Administered 2016-09-24: 10 mg via INTRAVENOUS

## 2016-09-24 MED ORDER — DULOXETINE HCL 60 MG PO CPEP: 60.0000 mg | ORAL_CAPSULE | Freq: Two times a day (BID) | ORAL | Status: DC

## 2016-09-24 MED ORDER — GABAPENTIN 600 MG PO TABS
600.0000 mg | ORAL_TABLET | Freq: Three times a day (TID) | ORAL | Status: DC
  Administered 2016-09-24 (×2): 600 mg via ORAL
  Filled 2016-09-24 (×2): qty 1

## 2016-09-24 MED ORDER — PROCHLORPERAZINE MALEATE 10 MG PO TABS: 10.0000 mg | ORAL_TABLET | Freq: Four times a day (QID) | ORAL | Status: DC | PRN

## 2016-09-24 MED ORDER — FENTANYL CITRATE (PF) 250 MCG/5ML IJ SOLN
INTRAMUSCULAR | Status: DC | PRN
  Administered 2016-09-24 (×3): 50 ug via INTRAVENOUS

## 2016-09-24 MED ORDER — DEXAMETHASONE SODIUM PHOSPHATE 10 MG/ML IJ SOLN
INTRAMUSCULAR | Status: DC | PRN
  Administered 2016-09-24: 10 mg via INTRAVENOUS

## 2016-09-24 MED ORDER — GABAPENTIN 300 MG PO CAPS
300.0000 mg | ORAL_CAPSULE | ORAL | Status: AC
  Administered 2016-09-24: 300 mg via ORAL
  Filled 2016-09-24: qty 1

## 2016-09-24 SURGICAL SUPPLY — 43 items
ADH SKN CLS APL DERMABOND .7 (GAUZE/BANDAGES/DRESSINGS) ×1
BINDER ABD UNIV 10 28-50 (GAUZE/BANDAGES/DRESSINGS) IMPLANT
BINDER ABDOM UNIV 10 (GAUZE/BANDAGES/DRESSINGS) ×3
CANISTER SUCT 3000ML PPV (MISCELLANEOUS) ×3 IMPLANT
CHLORAPREP W/TINT 26ML (MISCELLANEOUS) ×3 IMPLANT
COVER SURGICAL LIGHT HANDLE (MISCELLANEOUS) ×3 IMPLANT
DERMABOND ADVANCED (GAUZE/BANDAGES/DRESSINGS) ×2
DERMABOND ADVANCED .7 DNX12 (GAUZE/BANDAGES/DRESSINGS) IMPLANT
DRAPE LAPAROSCOPIC ABDOMINAL (DRAPES) ×3 IMPLANT
ELECT CAUTERY BLADE 6.4 (BLADE) ×3 IMPLANT
ELECT REM PT RETURN 9FT ADLT (ELECTROSURGICAL) ×3
ELECTRODE REM PT RTRN 9FT ADLT (ELECTROSURGICAL) ×1 IMPLANT
GAUZE SPONGE 4X4 12PLY STRL (GAUZE/BANDAGES/DRESSINGS) ×2 IMPLANT
GLOVE BIO SURGEON STRL SZ7.5 (GLOVE) ×5 IMPLANT
GLOVE BIOGEL PI IND STRL 6.5 (GLOVE) IMPLANT
GLOVE BIOGEL PI IND STRL 7.5 (GLOVE) IMPLANT
GLOVE BIOGEL PI IND STRL 8.5 (GLOVE) IMPLANT
GLOVE BIOGEL PI INDICATOR 6.5 (GLOVE) ×2
GLOVE BIOGEL PI INDICATOR 7.5 (GLOVE) ×2
GLOVE BIOGEL PI INDICATOR 8.5 (GLOVE) ×4
GLOVE ECLIPSE 8.0 STRL XLNG CF (GLOVE) ×2 IMPLANT
GOWN STRL REUS W/ TWL LRG LVL3 (GOWN DISPOSABLE) ×3 IMPLANT
GOWN STRL REUS W/ TWL XL LVL3 (GOWN DISPOSABLE) IMPLANT
GOWN STRL REUS W/TWL LRG LVL3 (GOWN DISPOSABLE) ×6
GOWN STRL REUS W/TWL XL LVL3 (GOWN DISPOSABLE) ×6
KIT BASIN OR (CUSTOM PROCEDURE TRAY) ×3 IMPLANT
KIT ROOM TURNOVER OR (KITS) ×3 IMPLANT
LIGASURE IMPACT 36 18CM CVD LR (INSTRUMENTS) ×2 IMPLANT
MESH VENT ST 27.4X34.9CM OVL (Mesh General) ×2 IMPLANT
NDL HYPO 25GX1X1/2 BEV (NEEDLE) IMPLANT
NEEDLE HYPO 25GX1X1/2 BEV (NEEDLE) ×3 IMPLANT
NS IRRIG 1000ML POUR BTL (IV SOLUTION) ×3 IMPLANT
PACK GENERAL/GYN (CUSTOM PROCEDURE TRAY) ×3 IMPLANT
PAD ABD 8X10 STRL (GAUZE/BANDAGES/DRESSINGS) ×2 IMPLANT
PAD ARMBOARD 7.5X6 YLW CONV (MISCELLANEOUS) ×3 IMPLANT
SUT MNCRL AB 4-0 PS2 18 (SUTURE) ×2 IMPLANT
SUT NOVA 1 T20/GS 25DT (SUTURE) ×4 IMPLANT
SUT NOVA NAB DX-16 0-1 5-0 T12 (SUTURE) ×2 IMPLANT
SUT SILK 2 0 (SUTURE) ×3
SUT SILK 2-0 18XBRD TIE 12 (SUTURE) ×1 IMPLANT
SUT VIC AB 2-0 SH 18 (SUTURE) ×2 IMPLANT
SYR CONTROL 10ML LL (SYRINGE) ×2 IMPLANT
TOWEL OR 17X24 6PK STRL BLUE (TOWEL DISPOSABLE) ×3 IMPLANT

## 2016-09-24 NOTE — Transfer of Care (Signed)
Immediate Anesthesia Transfer of Care Note  Patient: [name]  Procedure(s) Performed: Procedure(s): VENTRAL HERNIA REPAIR WITH MESH (N/A) INSERTION OF MESH (N/A)  Patient Location: PACU  Anesthesia Type:General  Level of Consciousness: awake, alert , oriented and patient cooperative  Airway & Oxygen Therapy: Patient Spontanous Breathing and Patient connected to nasal cannula oxygen  Post-op Assessment: Report given to RN, Post -op Vital signs reviewed and stable and Patient moving all extremities X 4  Post vital signs: Reviewed and stable  Last Vitals:  Vitals:   09/24/16 0658  BP: (!) 161/74  Pulse: 84  Resp: 20  Temp: 36.6 C  SpO2: 98%    Last Pain:  Vitals:   09/24/16 0715  TempSrc:   PainSc: 0-No pain      Patients Stated Pain Goal: 3 (55/20/80 2233)  Complications: No apparent anesthesia complications

## 2016-09-24 NOTE — Anesthesia Preprocedure Evaluation (Signed)
Anesthesia Evaluation  Patient identified by MRN, date of birth, ID band Patient awake    Reviewed: Allergy & Precautions, NPO status , Patient's Chart, lab work & pertinent test results  History of Anesthesia Complications (+) PONV  Airway Mallampati: II  TM Distance: >3 FB Neck ROM: Full    Dental no notable dental hx. (+) Edentulous Upper, Upper Dentures   Pulmonary neg pulmonary ROS,    Pulmonary exam normal breath sounds clear to auscultation       Cardiovascular negative cardio ROS Normal cardiovascular exam Rhythm:Regular Rate:Normal     Neuro/Psych negative neurological ROS  negative psych ROS   GI/Hepatic negative GI ROS, Neg liver ROS,   Endo/Other  Hypothyroidism Morbid obesity  Renal/GU negative Renal ROS  negative genitourinary   Musculoskeletal  (+) Fibromyalgia -  Abdominal   Peds negative pediatric ROS (+)  Hematology negative hematology ROS (+)   Anesthesia Other Findings   Reproductive/Obstetrics negative OB ROS                             Anesthesia Physical Anesthesia Plan  ASA: III  Anesthesia Plan: General   Post-op Pain Management:    Induction: Intravenous  PONV Risk Score and Plan: 4 or greater and Ondansetron, Dexamethasone, Scopolamine patch - Pre-op and Treatment may vary due to age or medical condition  Airway Management Planned: Oral ETT  Additional Equipment:   Intra-op Plan:   Post-operative Plan: Extubation in OR  Informed Consent: I have reviewed the patients History and Physical, chart, labs and discussed the procedure including the risks, benefits and alternatives for the proposed anesthesia with the patient or authorized representative who has indicated his/her understanding and acceptance.   Dental advisory given  Plan Discussed with: CRNA  Anesthesia Plan Comments:         Anesthesia Quick Evaluation

## 2016-09-24 NOTE — Anesthesia Procedure Notes (Signed)
Procedure Name: Intubation Date/Time: 09/24/2016 9:20 AM Performed by: Julieta Bellini Pre-anesthesia Checklist: Patient identified, Emergency Drugs available, Suction available and Patient being monitored Patient Re-evaluated:Patient Re-evaluated prior to induction Oxygen Delivery Method: Circle system utilized Preoxygenation: Pre-oxygenation with 100% oxygen Induction Type: IV induction Ventilation: Mask ventilation without difficulty Laryngoscope Size: Mac and 3 Grade View: Grade I Tube type: Oral Tube size: 7.0 mm Number of attempts: 1 Airway Equipment and Method: Stylet Placement Confirmation: ETT inserted through vocal cords under direct vision,  positive ETCO2 and breath sounds checked- equal and bilateral Secured at: 21 cm Tube secured with: Tape Dental Injury: Teeth and Oropharynx as per pre-operative assessment

## 2016-09-24 NOTE — H&P (Signed)
[name]  Location: [facility] Patient #: [id number] DOB: 1970-01-29 Divorced / Language: Cleophus Molt / Race: White Female   History of Present Illness The patient is a 46 year old female who presents with abdominal pain. We're asked to see the patient in consultation by Dr. Cyndi Bender to evaluate her for a ventral hernia. The patient is a 46 year old white female who states that she has had pain and a bulge at her belly button since her gallbladder surgery in February 2001. She has had some occasional nausea but no vomiting. She does have some discomfort associated with it. She also notes some constipation which is a chronic problem for her.   Past Surgical History  Foot Surgery  Right. Gallbladder Surgery - Laparoscopic  Knee Surgery  Right. Oral Surgery   Diagnostic Studies History Colonoscopy  never Mammogram  1-3 years ago Pap Smear  1-5 years ago  Allergies  Cephalexin *CEPHALOSPORINS*  Nalbuphine HCl *ANALGESICS - OPIOID*  Nubain *ANALGESICS - OPIOID*  Ondansetron *ANTIEMETICS*  Allergies Reconciled   Medication History  Levothyroxine Sodium (137MCG Tablet, Oral) Active. Bayer Back & Body (500-32.5MG  Tablet, Oral) Active. Cetirizine HCl (10MG  Tablet, Oral) Active. Cimetidine 200 (200MG  Tablet, Oral) Active. DULoxetine HCl (60MG  Capsule DR Part, Oral) Active. Gabapentin (600MG  Tablet, Oral) Active. Ibuprofen (200MG  Capsule, Oral) Active. SUMAtriptan Succinate (100MG  Tablet, Oral) Active. Medications Reconciled  Social History Caffeine use  Tea. No alcohol use  No drug use  Tobacco use  Never smoker.  Family History  Alcohol Abuse  Family Members In General. Arthritis  Mother. Breast Cancer  Mother. Depression  Mother. Heart Disease  Father. Kidney Disease  Father.  Pregnancy / Birth History  Age at menarche  33 years. Contraceptive History  Oral contraceptives. Gravida  1 Irregular periods   Maternal age  63-30 Para  1  Other Problems  Anxiety Disorder  Back Pain  Bladder Problems  Depression  Gastroesophageal Reflux Disease  Kidney Stone  Migraine Headache  Thyroid Disease  Umbilical Hernia Repair     Review of Systems General Present- Fatigue and Night Sweats. Not Present- Appetite Loss, Chills, Fever, Weight Gain and Weight Loss. Skin Present- Dryness. Not Present- Change in Wart/Mole, Hives, Jaundice, New Lesions, Non-Healing Wounds, Rash and Ulcer. HEENT Present- Seasonal Allergies, Visual Disturbances and Wears glasses/contact lenses. Not Present- Earache, Hearing Loss, Hoarseness, Nose Bleed, Oral Ulcers, Ringing in the Ears, Sinus Pain, Sore Throat and Yellow Eyes. Respiratory Not Present- Bloody sputum, Chronic Cough, Difficulty Breathing, Snoring and Wheezing. Breast Not Present- Breast Mass, Breast Pain, Nipple Discharge and Skin Changes. Cardiovascular Present- Palpitations and Swelling of Extremities. Not Present- Chest Pain, Difficulty Breathing Lying Down, Leg Cramps, Rapid Heart Rate and Shortness of Breath. Gastrointestinal Present- Constipation and Difficulty Swallowing. Not Present- Abdominal Pain, Bloating, Bloody Stool, Change in Bowel Habits, Chronic diarrhea, Excessive gas, Gets full quickly at meals, Hemorrhoids, Indigestion, Nausea, Rectal Pain and Vomiting. Female Genitourinary Not Present- Frequency, Nocturia, Painful Urination, Pelvic Pain and Urgency. Musculoskeletal Present- Back Pain, Joint Pain, Joint Stiffness, Muscle Pain, Muscle Weakness and Swelling of Extremities. Neurological Present- Numbness, Tingling and Weakness. Not Present- Decreased Memory, Fainting, Headaches, Seizures, Tremor and Trouble walking. Psychiatric Present- Anxiety and Depression. Not Present- Bipolar, Change in Sleep Pattern, Fearful and Frequent crying. Endocrine Present- Heat Intolerance. Not Present- Cold Intolerance, Excessive Hunger, Hair Changes,  Hot flashes and New Diabetes. Hematology Not Present- Blood Thinners, Easy Bruising, Excessive bleeding, Gland problems, HIV and Persistent Infections.  Vitals  Weight: 359.2 lb Height:  69in Body Surface Area: 2.65 m Body Mass Index: 53.04 kg/m  Temp.: 97.50F  Pulse: 90 (Regular)  P.OX: 99% (Room air) BP: 112/82 (Sitting, Left Arm, Standard)       Physical Exam  General Mental Status-Alert. General Appearance-Consistent with stated age. Hydration-Well hydrated. Voice-Normal.  Head and Neck Head-normocephalic, atraumatic with no lesions or palpable masses. Trachea-midline. Thyroid Gland Characteristics - normal size and consistency.  Eye Eyeball - Bilateral-Extraocular movements intact. Sclera/Conjunctiva - Bilateral-No scleral icterus.  Chest and Lung Exam Chest and lung exam reveals -quiet, even and easy respiratory effort with no use of accessory muscles and on auscultation, normal breath sounds, no adventitious sounds and normal vocal resonance. Inspection Chest Wall - Normal. Back - normal.  Cardiovascular Cardiovascular examination reveals -normal heart sounds, regular rate and rhythm with no murmurs and normal pedal pulses bilaterally.  Abdomen Note: The abdomen is soft and nontender. There is a moderate size umbilical hernia that only partially reduces. The overlying skin is slightly ischemic appearing. There is mild tenderness to palpation of the hernia itself.   Neurologic Neurologic evaluation reveals -alert and oriented x 3 with no impairment of recent or remote memory. Mental Status-Normal.  Musculoskeletal Normal Exam - Left-Upper Extremity Strength Normal and Lower Extremity Strength Normal. Normal Exam - Right-Upper Extremity Strength Normal and Lower Extremity Strength Normal.  Lymphatic Head & Neck  General Head & Neck Lymphatics: Bilateral - Description - Normal. Axillary  General Axillary Region:  Bilateral - Description - Normal. Tenderness - Non Tender. Femoral & Inguinal  Generalized Femoral & Inguinal Lymphatics: Bilateral - Description - Normal. Tenderness - Non Tender.    Assessment & Plan VENTRAL HERNIA WITHOUT OBSTRUCTION OR GANGRENE (K43.9) Impression: The patient appears to have a moderate size ventral hernia near the umbilicus. Because the risk of incarceration and strangulation and think she would benefit from having this fixed. She would also like to have this done. I have discussed with her in detail the risks and benefits of the operation to fix the hernia as well as some of the technical aspects including the use of mesh and the risk of injury to the intestine and she understands and wishes to proceed. She would prefer an open type of repair. Current Plans Pt Education - Hernia: discussed with patient and provided information.

## 2016-09-24 NOTE — Op Note (Signed)
09/24/2016  10:23 AM  PATIENT:  [name]  46 y.o. female  PRE-OPERATIVE DIAGNOSIS:  ventral hernia  POST-OPERATIVE DIAGNOSIS:  ventral hernia  PROCEDURE:  Procedure(s): VENTRAL HERNIA REPAIR WITH MESH (N/A) INSERTION OF MESH (N/A)  SURGEON:  Surgeon(s) and Role:    * Jovita Kussmaul, MD - Primary  PHYSICIAN ASSISTANT:   ASSISTANTS: Sharyn Dross, RNFA   ANESTHESIA:   local and general  EBL:  Total I/O In: -  Out: 5 [Blood:5]  BLOOD ADMINISTERED:none  DRAINS: none   LOCAL MEDICATIONS USED:  MARCAINE     SPECIMEN:  No Specimen  DISPOSITION OF SPECIMEN:  N/A  COUNTS:  YES  TOURNIQUET:  * No tourniquets in log *  DICTATION: .Dragon Dictation   After informed consent was obtained the patient was brought to the operating room and placed in the supine position on the operating table. After adequate induction of general anesthesia the patient's abdomen was prepped with ChloraPrep, allowed to dry, and draped in usual sterile manner. The area around the umbilicus was infiltrated with quarter percent Marcaine. An appropriate timeout was performed. An incision Was made along the inferior edge of the umbilicus with a 10 blade knife. The incision was carried through the skin and subcutaneous tissue sharply with electrocautery. The hernia sac was identified. The hernia sac was opened sharply with electrocautery. The hernia sac contained only omentum. The omentum was freed from the edge of the hernia sac sharply with the electrocautery. Some of the omentum was excised using a LigaSure. The rest of the omentum was then reduced through the fascial defect. The fascial defect was actually fairly small. The hernia sac was excised sharply with the electrocautery. An 8 cm circular piece of ventral light mesh was chosen. The mesh was placed into the abdominal cavity through the fascial defect. The mesh was anchored in 4 places with #1 Novafil U stitches. The mesh was oriented with the  coated side towards the bowel. The fascial defect was then closed with interrupted #1 Novafil stitches incorporating the anchors of the mesh. Once this was accomplished the mesh appeared to be in good position and the hernia was repaired. The wound was irrigated with copious amounts of saline. The deep layer of the wound was then closed with layers of interrupted 2-0 Vicryl stitches. The skin was then closed with a running 4-0 Monocryl subcuticular stitch. Dermabond dressings were applied. The patient tolerated the procedure well. At the end of the case all needle sponge and instrument counts were correct. The patient was then awakened and taken to recovery in stable condition.  PLAN OF CARE: Discharge to home after PACU  PATIENT DISPOSITION:  PACU - hemodynamically stable.   Delay start of Pharmacological VTE agent (>24hrs) due to surgical blood loss or risk of bleeding: not applicable

## 2016-09-24 NOTE — Interval H&P Note (Signed)
History and Physical Interval Note:  09/24/2016 9:01 AM  [name]  has presented today for surgery, with the diagnosis of ventral hernia  The various methods of treatment have been discussed with the patient and family. After consideration of risks, benefits and other options for treatment, the patient has consented to  Procedure(s): VENTRAL HERNIA REPAIR WITH MESH (N/A) INSERTION OF MESH (N/A) as a surgical intervention .  The patient's history has been reviewed, patient examined, no change in status, stable for surgery.  I have reviewed the patient's chart and labs.  Questions were answered to the patient's satisfaction.     TOTH III,PAUL S

## 2016-09-25 DIAGNOSIS — K439 Ventral hernia without obstruction or gangrene: Secondary | ICD-10-CM | POA: Diagnosis not present

## 2016-09-25 NOTE — Discharge Summary (Signed)
Physician Discharge Summary  Patient ID: [name] MRN: [id number] DOB/AGE: 09-14-1970 46 y.o.  Admit date: 09/24/2016 Discharge date: 09/25/2016  Admission Diagnoses:  Ventral hernia  Discharge Diagnoses: same Active Problems:   S/P repair of ventral hernia   Ventral hernia without obstruction or gangrene   Discharged Condition: good  Hospital Course: Repair of ventral hernia/ excision of omentum on 09/24/16 by Dr. Marlou Starks.  Hernia repair with 8 cm Ventralight.  Skin closed with dermabond.  Patient stayed overnight because no family available to assist at home.  Consults: None    Discharge Exam: Blood pressure (!) 146/68, pulse 66, temperature 98.2 F (36.8 C), temperature source Oral, resp. rate 18, height 5\' 9"  (1.753 m), weight (!) 159.7 kg (352 lb), last menstrual period 08/30/2016, SpO2 100 %. General appearance: alert, cooperative and no distress GI: soft, incisional tenderness Transverse umbilical incision - sealed with Dermabond - no drainage  Disposition: 01-Home or Self Care  Discharge Instructions    Call MD for:  difficulty breathing, headache or visual disturbances    Complete by:  As directed    Call MD for:  extreme fatigue    Complete by:  As directed    Call MD for:  hives    Complete by:  As directed    Call MD for:  persistant dizziness or light-headedness    Complete by:  As directed    Call MD for:  persistant nausea and vomiting    Complete by:  As directed    Call MD for:  persistant nausea and vomiting    Complete by:  As directed    Call MD for:  redness, tenderness, or signs of infection (pain, swelling, redness, odor or green/yellow discharge around incision site)    Complete by:  As directed    Call MD for:  redness, tenderness, or signs of infection (pain, swelling, redness, odor or green/yellow discharge around incision site)    Complete by:  As directed    Call MD for:  severe uncontrolled pain    Complete by:  As directed    Call MD  for:  severe uncontrolled pain    Complete by:  As directed    Call MD for:  temperature >100.4    Complete by:  As directed    Call MD for:  temperature >100.4    Complete by:  As directed    Diet - low sodium heart healthy    Complete by:  As directed    Diet general    Complete by:  As directed    Discharge instructions    Complete by:  As directed    May shower. No heavy lifting. Diet as tolerated   Driving Restrictions    Complete by:  As directed    Do not drive while taking pain medications   Increase activity slowly    Complete by:  As directed    Increase activity slowly    Complete by:  As directed    May shower / Bathe    Complete by:  As directed    No wound care    Complete by:  As directed      Allergies as of 09/25/2016      Reactions   Nalbuphine Other (See Comments)   (Nubain)--entire body hot/lightheaded dizziness w/blacked out   Ondansetron Other (See Comments)   (zofran)--pt is unable to recall reaction type      Medication List    TAKE these medications  BAYER BACK & BODY 500-32.5 MG Tabs Generic drug:  Aspirin-Caffeine Take 2 tablets by mouth daily as needed (for pain).   cetirizine 10 MG tablet Commonly known as:  ZYRTEC Take 10 mg by mouth at bedtime.   cimetidine 200 MG tablet Commonly known as:  TAGAMET Take 200 mg by mouth daily before breakfast.   DULoxetine 60 MG capsule Commonly known as:  CYMBALTA TAKE ONE CAPSULE BY MOUTH ONCE DAILY. What changed:  how much to take  how to take this  when to take this  additional instructions   erythromycin ophthalmic ointment Place 1 application into the left eye 2 (two) times daily.   gabapentin 600 MG tablet Commonly known as:  NEURONTIN Take 600 mg by mouth 3 (three) times daily.   HYDROcodone-acetaminophen 5-325 MG tablet Commonly known as:  NORCO/VICODIN Take 1-2 tablets by mouth every 4 (four) hours as needed for moderate pain or severe pain.   ibuprofen 200 MG  tablet Commonly known as:  ADVIL,MOTRIN Take 800 mg by mouth every 8 (eight) hours as needed (for pain.).   levothyroxine 137 MCG tablet Commonly known as:  SYNTHROID, LEVOTHROID Take 137 mcg by mouth daily before breakfast.   SUMAtriptan 100 MG tablet Commonly known as:  IMITREX Take 100 mg by mouth every 2 (two) hours as needed for migraine. May repeat in 2 hours if headache persists or recurs.            Discharge Care Instructions        Start     Ordered   09/25/16 0000  Diet general     09/25/16 0813   09/25/16 0000  Increase activity slowly     09/25/16 0813   09/25/16 0000  May shower / Bathe     09/25/16 0813   09/25/16 0000  Driving Restrictions    Comments:  Do not drive while taking pain medications   09/25/16 0813   09/25/16 0000  Call MD for:  temperature >100.4     09/25/16 0813   09/25/16 0000  Call MD for:  persistant nausea and vomiting     09/25/16 0813   09/25/16 0000  Call MD for:  severe uncontrolled pain     09/25/16 0813   09/25/16 0000  Call MD for:  redness, tenderness, or signs of infection (pain, swelling, redness, odor or green/yellow discharge around incision site)     09/25/16 0813   09/24/16 0000  Diet - low sodium heart healthy     09/24/16 1049   09/24/16 0000  Increase activity slowly     09/24/16 1049   09/24/16 0000  Discharge instructions    Comments:  May shower. No heavy lifting. Diet as tolerated   09/24/16 1049   09/24/16 0000  No wound care     09/24/16 1049   09/24/16 0000  Call MD for:  temperature >100.4     09/24/16 1049   09/24/16 0000  Call MD for:  persistant nausea and vomiting     09/24/16 1049   09/24/16 0000  Call MD for:  severe uncontrolled pain     09/24/16 1049   09/24/16 0000  Call MD for:  redness, tenderness, or signs of infection (pain, swelling, redness, odor or green/yellow discharge around incision site)     09/24/16 1049   09/24/16 0000  Call MD for:  difficulty breathing, headache or visual  disturbances     09/24/16 1049   09/24/16 0000  Call MD for:  hives     09/24/16 1049   09/24/16 0000  Call MD for:  persistant dizziness or light-headedness     09/24/16 1049   09/24/16 0000  Call MD for:  extreme fatigue     09/24/16 1049   09/24/16 0000  HYDROcodone-acetaminophen (NORCO/VICODIN) 5-325 MG tablet  Every 4 hours PRN     09/24/16 1049     Follow-up Information    Autumn Messing III, MD Follow up in 2 week(s).   Specialty:  General Surgery Contact information: [place] [address] [phone]           Signed: Maia Petties. 09/25/2016, 8:13 AM

## 2016-09-25 NOTE — Discharge Instructions (Signed)
[facility]      [facility] [phone]  ABDOMINAL SURGERY: POST OP INSTRUCTIONS  Always review your discharge instruction sheet given to you by the facility where your surgery was performed.  IF YOU HAVE DISABILITY OR FAMILY LEAVE FORMS, YOU MUST BRING THEM TO THE OFFICE FOR PROCESSING.  PLEASE DO NOT GIVE THEM TO YOUR DOCTOR.  1. A prescription for pain medication may be given to you upon discharge.  Take your pain medication as prescribed, if needed.  If narcotic pain medicine is not needed, then you may take acetaminophen (Tylenol) or ibuprofen (Advil) as needed. 2. Take your usually prescribed medications unless otherwise directed. 3. If you need a refill on your pain medication, please contact your pharmacy. They will contact our office to request authorization.  Prescriptions will not be filled after 5pm or on week-ends. 4. You should follow a light diet the first few days after arrival home, such as soup and crackers, pudding, etc.unless your doctor has advised otherwise. A high-fiber, low fat diet can be resumed as tolerated.   Be sure to include lots of fluids daily. Most patients will experience some swelling and bruising on the chest and neck area.  Ice packs will help.  Swelling and bruising can take several days to resolve 5. Most patients will experience some swelling and bruising in the area of the incision. Ice pack will help. Swelling and bruising can take several days to resolve..  6. It is common to experience some constipation if taking pain medication after surgery.  Increasing fluid intake and taking a stool softener will usually help or prevent this problem from occurring.  A mild laxative (Milk of Magnesia or Miralax) should be taken according to package directions if there are no bowel movements after 48 hours. 7.  You may have steri-strips (small skin tapes) in place directly over the incision.  These strips should be left on the skin for 7-10 days.  If your  surgeon used skin glue on the incision, you may shower in 24 hours.  The glue will flake off over the next 2-3 weeks.  Any sutures or staples will be removed at the office during your follow-up visit. You may find that a light gauze bandage over your incision may keep your staples from being rubbed or pulled. You may shower and replace the bandage daily. 8. ACTIVITIES:  You may resume regular (light) daily activities beginning the next day--such as daily self-care, walking, climbing stairs--gradually increasing activities as tolerated.  You may have sexual intercourse when it is comfortable.  Refrain from any heavy lifting or straining until approved by your doctor. a. You may drive when you no longer are taking prescription pain medication, you can comfortably wear a seatbelt, and you can safely maneuver your car and apply brakes b. Return to Work: ___________________________________ 9. You should see your doctor in the office for a follow-up appointment approximately two weeks after your surgery.  Make sure that you call for this appointment within a day or two after you arrive home to insure a convenient appointment time. OTHER INSTRUCTIONS:  _____________________________________________________________ _____________________________________________________________  WHEN TO CALL YOUR DOCTOR: 1. Fever over 101.0 2. Inability to urinate 3. Nausea and/or vomiting 4. Extreme swelling or bruising 5. Continued bleeding from incision. 6. Increased pain, redness, or drainage from the incision. 7. Difficulty swallowing or breathing 8. Muscle cramping or spasms. 9. Numbness or tingling in hands or feet or around lips.  The clinic staff is available to answer   answer your questions during regular business hours.  Please dont hesitate to call and ask to speak to one of the nurses if you have concerns.  For further questions, please visit [email or web address]

## 2016-09-25 NOTE — Care Management Note (Signed)
Case Management Note  Patient Details  Name: [name] MRN: [id number] Date of Birth: 1970/05/21  Subjective/Objective:                 Patient with order to DC to home today. Chart reviewed. No Home Health or Equipment needs, no unacknowledged Case Management consults or medication needs identified at the time of this note. Plan for DC to home. If needs arise today prior to discharge, please call Carles Collet RN CM at [phone].    Action/Plan:   Expected Discharge Date:  09/25/16               Expected Discharge Plan:  Home/Self Care  In-House Referral:     Discharge planning Services  CM Consult  Post Acute Care Choice:    Choice offered to:     DME Arranged:    DME Agency:     HH Arranged:    HH Agency:     Status of Service:  Completed, signed off  If discussed at [employer] of Stay Meetings, dates discussed:    Additional Comments:  Carles Collet, RN 09/25/2016, 9:11 AM

## 2016-09-27 ENCOUNTER — Encounter (HOSPITAL_COMMUNITY): Payer: Self-pay | Admitting: General Surgery

## 2016-10-04 NOTE — Anesthesia Postprocedure Evaluation (Signed)
Anesthesia Post Note  Patient: [name]  Procedure(s) Performed: Procedure(s) (LRB): VENTRAL HERNIA REPAIR WITH MESH (N/A) INSERTION OF MESH (N/A)     Patient location during evaluation: PACU Anesthesia Type: General Level of consciousness: awake and alert Pain management: pain level controlled Vital Signs Assessment: post-procedure vital signs reviewed and stable Respiratory status: spontaneous breathing, nonlabored ventilation, respiratory function stable and patient connected to nasal cannula oxygen Cardiovascular status: blood pressure returned to baseline and stable Postop Assessment: no signs of nausea or vomiting Anesthetic complications: no    Last Vitals:  Vitals:   09/24/16 2100 09/25/16 0540  BP: (!) 130/55 (!) 146/68  Pulse: 69 66  Resp:  18  Temp: 36.8 C 36.8 C  SpO2: 99% 100%    Last Pain:  Vitals:   09/25/16 0847  TempSrc:   PainSc: 3                  [name]

## 2017-04-06 ENCOUNTER — Emergency Department (HOSPITAL_COMMUNITY)
Admission: EM | Admit: 2017-04-06 | Discharge: 2017-04-06 | Disposition: A | Discharge status: Home / Self Care | Payer: Self-pay | Attending: Emergency Medicine | Admitting: Emergency Medicine

## 2017-04-06 ENCOUNTER — Emergency Department (HOSPITAL_COMMUNITY): Payer: Self-pay

## 2017-04-06 ENCOUNTER — Encounter (HOSPITAL_COMMUNITY): Payer: Self-pay

## 2017-04-06 DIAGNOSIS — Y9301 Activity, walking, marching and hiking: Secondary | ICD-10-CM | POA: Insufficient documentation

## 2017-04-06 DIAGNOSIS — W010XXA Fall on same level from slipping, tripping and stumbling without subsequent striking against object, initial encounter: Secondary | ICD-10-CM | POA: Insufficient documentation

## 2017-04-06 DIAGNOSIS — S92414A Nondisplaced fracture of proximal phalanx of right great toe, initial encounter for closed fracture: Secondary | ICD-10-CM | POA: Insufficient documentation

## 2017-04-06 DIAGNOSIS — E039 Hypothyroidism, unspecified: Secondary | ICD-10-CM | POA: Insufficient documentation

## 2017-04-06 DIAGNOSIS — Z79899 Other long term (current) drug therapy: Secondary | ICD-10-CM | POA: Insufficient documentation

## 2017-04-06 DIAGNOSIS — Y999 Unspecified external cause status: Secondary | ICD-10-CM | POA: Insufficient documentation

## 2017-04-06 DIAGNOSIS — Y929 Unspecified place or not applicable: Secondary | ICD-10-CM | POA: Insufficient documentation

## 2017-04-06 NOTE — ED Triage Notes (Signed)
Pt presents for evaluation of R foot pain after fall 2 days ago. Pt reports swelling, redness and tingling starting yesterday PM.

## 2017-04-06 NOTE — Consult Note (Signed)
Reason for Consult:Right great toe fx Referring Physician: R Little  [name] is an 47 y.o. female.  HPI: [name] was walking barefoot in her bedroom and tripped and fell. She had immediate right great toe pain but thought maybe she just jammed it. When the swelling and bruising got worse she came to ED for evaluation. X-rays showed a prox phalanx fx and orthopedic surgery was consulted. She does c/o some numbness in the foot.  Past Medical History:  Diagnosis Date  . Allergy   . Anxiety    reactive   . Depression   . Fibromyalgia   . GERD (gastroesophageal reflux disease)   . History of kidney stones   . History of nephrolithiasis 11/2008   under urology care uric acid rx ?  Marland Kitchen Hypothyroid   . PONV (postoperative nausea and vomiting)   . Varicose veins   . Ventral hernia     Past Surgical History:  Procedure Laterality Date  . CARPAL TUNNEL RELEASE    . CHOLECYSTECTOMY    . FOOT SURGERY    . INSERTION OF MESH N/A 09/24/2016   Procedure: INSERTION OF MESH;  Surgeon: Jovita Kussmaul, MD;  Location: [facility];  Service: General;  Laterality: N/A;  . LITHOTRIPSY     twice by Dr. Gaynelle Arabian  . rt knee surgery  08/1987  . teeth extracted     on top for decay  . VENTRAL HERNIA REPAIR  09/24/2016  . VENTRAL HERNIA REPAIR N/A 09/24/2016   Procedure: VENTRAL HERNIA REPAIR WITH MESH;  Surgeon: Jovita Kussmaul, MD;  Location: [facility] OR;  Service: General;  Laterality: N/A;    Family History  Problem Relation Age of Onset  . Breast cancer Mother        deceased 05/03/2007  . Osteoarthritis Mother   . ADD / ADHD Son   . Heart disease Father   . Pneumonia Father        Deceased, 80  . Healthy Sister     Social History:  reports that  has never smoked. she has never used smokeless tobacco. She reports that she does not drink alcohol or use drugs.  Allergies:  Allergies  Allergen Reactions  . Nalbuphine Other (See Comments)    (Nubain)--entire body hot/lightheaded dizziness w/blacked  out  . Ondansetron Other (See Comments)    (zofran)--pt is unable to recall reaction type    Medications: I have reviewed the patient's current medications.  No results found for this or any previous visit (from the past 48 hour(s)).  Dg Foot Complete Right  Result Date: 04/06/2017 CLINICAL DATA:  Trauma to great toe. EXAM: RIGHT FOOT COMPLETE - 3+ VIEW COMPARISON:  None. FINDINGS: A nondisplaced comminuted fracture is present in the proximal phalanx of the great toe. Joints are located. Postsurgical changes are present in the first metatarsal. Surrounding soft tissue swelling is noted. A plantar calcaneal spur is noted. IMPRESSION: 1. Nondisplaced comminuted fracture of the proximal phalanx in the right great toe with associated soft tissue swelling. 2. Postsurgical changes of the first metatarsal. Electronically Signed   By: San Morelle M.D.   On: 04/06/2017 11:49    Review of Systems  Constitutional: Negative for weight loss.  HENT: Negative for ear discharge, ear pain, hearing loss and tinnitus.   Eyes: Negative for blurred vision, double vision, photophobia and pain.  Respiratory: Negative for cough, sputum production and shortness of breath.   Cardiovascular: Negative for chest pain.  Gastrointestinal: Negative for abdominal pain,  nausea and vomiting.  Genitourinary: Negative for dysuria, flank pain, frequency and urgency.  Musculoskeletal: Positive for joint pain (Right great toe). Negative for back pain, falls, myalgias and neck pain.  Neurological: Positive for sensory change. Negative for dizziness, tingling, focal weakness, loss of consciousness and headaches.  Endo/Heme/Allergies: Does not bruise/bleed easily.  Psychiatric/Behavioral: Negative for depression, memory loss and substance abuse. The patient is not nervous/anxious.    Blood pressure 135/68, pulse 66, temperature 97.6 F (36.4 C), temperature source Oral, resp. rate 16, height 5\' 9"  (1.753 m), weight (!)  136.5 kg (301 lb), last menstrual period 03/27/2017, SpO2 99 %. Physical Exam  Constitutional: She appears well-developed and well-nourished. No distress.  HENT:  Head: Normocephalic and atraumatic.  Eyes: Conjunctivae are normal. Right eye exhibits no discharge. Left eye exhibits no discharge. No scleral icterus.  Neck: Normal range of motion.  Cardiovascular: Normal rate and regular rhythm.  Respiratory: Effort normal. No respiratory distress.  Musculoskeletal:  RLE No traumatic wounds or rash  Ecchymosis lateral proximal great toe, edema proximal to that  No knee or ankle effusion  Knee stable to varus/ valgus and anterior/posterior stress  Sens DPN, TN intact, SPN paresthetic  Motor EHL, ext, flex, evers 5/5  DP 1+, PT 0, No significant edema  Neurological: She is alert.  Skin: Skin is warm and dry. She is not diaphoretic.  Psychiatric: She has a normal mood and affect. Her behavior is normal.    Assessment/Plan: Fall Right great toe proximal phalanx fx -- WBAT in hard-soled shoe, f/u in 2 weeks with Dr. Stann Mainland.    Lisette Abu, PA-C Orthopedic Surgery [phone] 04/06/2017, 2:05 PM

## 2017-04-06 NOTE — ED Notes (Signed)
Declined W/C at D/C and was escorted to lobby by RN. 

## 2017-04-06 NOTE — Discharge Instructions (Signed)
Use hard-soled shoe whenever walking.  Keep leg elevated whenever possible through Sunday.  Ice to toe 20 minutes 4x/day through Friday.
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
## 2018-11-20 ENCOUNTER — Other Ambulatory Visit: Payer: Self-pay | Admitting: Physician Assistant

## 2018-11-20 DIAGNOSIS — Z1231 Encounter for screening mammogram for malignant neoplasm of breast: Secondary | ICD-10-CM

## 2019-11-21 ENCOUNTER — Encounter (HOSPITAL_BASED_OUTPATIENT_CLINIC_OR_DEPARTMENT_OTHER): Payer: Self-pay | Admitting: Emergency Medicine

## 2019-11-21 ENCOUNTER — Other Ambulatory Visit (HOSPITAL_BASED_OUTPATIENT_CLINIC_OR_DEPARTMENT_OTHER): Payer: Self-pay | Admitting: Student

## 2019-11-21 ENCOUNTER — Emergency Department (HOSPITAL_BASED_OUTPATIENT_CLINIC_OR_DEPARTMENT_OTHER)
Admission: EM | Admit: 2019-11-21 | Discharge: 2019-11-21 | Disposition: A | Discharge status: Home / Self Care | Payer: Self-pay | Attending: Emergency Medicine | Admitting: Emergency Medicine

## 2019-11-21 ENCOUNTER — Other Ambulatory Visit: Payer: Self-pay

## 2019-11-21 DIAGNOSIS — M62838 Other muscle spasm: Secondary | ICD-10-CM

## 2019-11-21 DIAGNOSIS — E039 Hypothyroidism, unspecified: Secondary | ICD-10-CM | POA: Insufficient documentation

## 2019-11-21 DIAGNOSIS — Z79899 Other long term (current) drug therapy: Secondary | ICD-10-CM | POA: Insufficient documentation

## 2019-11-21 DIAGNOSIS — M79602 Pain in left arm: Secondary | ICD-10-CM | POA: Insufficient documentation

## 2019-11-21 DIAGNOSIS — M62830 Muscle spasm of back: Secondary | ICD-10-CM | POA: Insufficient documentation

## 2019-11-21 DIAGNOSIS — M542 Cervicalgia: Secondary | ICD-10-CM | POA: Insufficient documentation

## 2019-11-21 DIAGNOSIS — Z7982 Long term (current) use of aspirin: Secondary | ICD-10-CM | POA: Insufficient documentation

## 2019-11-21 MED ORDER — METHOCARBAMOL 500 MG PO TABS: 500.0000 mg | ORAL_TABLET | Freq: Two times a day (BID) | ORAL | 0 refills | Status: DC

## 2019-11-21 MED ORDER — KETOROLAC TROMETHAMINE 30 MG/ML IJ SOLN
30.0000 mg | Freq: Once | INTRAMUSCULAR | Status: DC
  Filled 2019-11-21: qty 1

## 2019-11-21 MED ORDER — KETOROLAC TROMETHAMINE 30 MG/ML IJ SOLN
30.0000 mg | Freq: Once | INTRAMUSCULAR | Status: AC
  Administered 2019-11-21: 30 mg via INTRAMUSCULAR

## 2019-11-21 MED FILL — METHOCARBAMOL 500 MG TABS: 500 | 7 days supply | Qty: 14 | Fill #0

## 2019-11-21 NOTE — ED Triage Notes (Addendum)
Pt sts she has constant pain in her neck and back of her head d/t c-spine issues.  Today she woke up unable to move her neck normally or her left arm.  Boyfriend insisted she come "be checked out."  Did drive to ED herself.
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
## 2019-11-21 NOTE — Discharge Instructions (Addendum)
You were seen and evaluated today in the emergency room for your neck and shoulder pain.  It appears that you have a severe spasm in your trapezius, which is the muscle in your shoulders and up into your neck, that is causing your pain.  You had been prescribed a muscle relaxer, called Robaxin.  It is important that you do not take this if you are going to drive or operate heavy machinery, as it can make you very sleepy.  You may continue to use Tylenol or ibuprofen at home for your pain.  You may utilize topical pain relief such as Biofreeze, icy hot, lidocaine patches such as Salonpas brand.  It will be important that you work on flexibility and relaxation of these muscles.  Please utilize the stretches  I showed you, after applying a hot compress /heating pad / or hot shower. You may have a loved one massage your muscles for you, or you may also seek therapy from a licensed massage therapist.  I would also encourage you to seek physical therapy.  You may self refer, You can call in a physical therapy location, and make an appointment for evaluation and treatment.  Please return the emergency department immediately if you develop any new numbness or tingling in your hands, weakness in your upper extremities, or other new severe symptoms.

## 2020-02-14 ENCOUNTER — Encounter (HOSPITAL_BASED_OUTPATIENT_CLINIC_OR_DEPARTMENT_OTHER): Payer: Self-pay

## 2020-02-14 ENCOUNTER — Emergency Department (HOSPITAL_BASED_OUTPATIENT_CLINIC_OR_DEPARTMENT_OTHER)
Admission: EM | Admit: 2020-02-14 | Discharge: 2020-02-14 | Discharge status: Against Medical Advice | Payer: Medicaid Other | Attending: Emergency Medicine | Admitting: Emergency Medicine

## 2020-02-14 ENCOUNTER — Other Ambulatory Visit: Payer: Self-pay

## 2020-02-14 DIAGNOSIS — Z79899 Other long term (current) drug therapy: Secondary | ICD-10-CM | POA: Insufficient documentation

## 2020-02-14 DIAGNOSIS — E039 Hypothyroidism, unspecified: Secondary | ICD-10-CM | POA: Insufficient documentation

## 2020-02-14 DIAGNOSIS — N939 Abnormal uterine and vaginal bleeding, unspecified: Secondary | ICD-10-CM

## 2020-02-14 DIAGNOSIS — N938 Other specified abnormal uterine and vaginal bleeding: Secondary | ICD-10-CM | POA: Insufficient documentation

## 2020-02-14 DIAGNOSIS — Z7982 Long term (current) use of aspirin: Secondary | ICD-10-CM | POA: Insufficient documentation

## 2020-02-14 LAB — WET PREP, GENITAL
Clue Cells Wet Prep HPF POC: NONE SEEN
Sperm: NONE SEEN
Trich, Wet Prep: NONE SEEN
Yeast Wet Prep HPF POC: NONE SEEN

## 2020-02-14 NOTE — Discharge Instructions (Signed)
You were evaluated for vaginal bleeding today.   We recommend that you follow up with a gynecologist for ultrasound of your uterus to access for potential structural causes for your bleeding.

## 2020-02-14 NOTE — ED Notes (Signed)
Patient stated "I'm leaving now, I've been here all day and I don't feel like I'm being listened to, I don't want any more tests done, I'm leaving".  Patient then walked out of the room and out of the ED.  Dr. Darl Householder notified.
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
## 2020-02-14 NOTE — ED Triage Notes (Signed)
Pt reports intermittent vaginal bleeding since 12/23, pt states she has heavy bleeeding since 1/5. Pt states she feels very weak and is having lower abdominal pain. Pt reports 48 pads since 5th of January.

## 2020-02-15 LAB — GC/CHLAMYDIA PROBE AMP (~~LOC~~) NOT AT ARMC
Chlamydia: NEGATIVE
Comment: NEGATIVE
Comment: NORMAL
Neisseria Gonorrhea: NEGATIVE

## 2020-02-29 ENCOUNTER — Other Ambulatory Visit: Payer: Self-pay | Admitting: Physician Assistant

## 2020-03-03 ENCOUNTER — Other Ambulatory Visit: Payer: Self-pay | Admitting: Physician Assistant

## 2020-03-04 ENCOUNTER — Other Ambulatory Visit: Payer: Self-pay | Admitting: Physician Assistant

## 2020-03-04 DIAGNOSIS — N939 Abnormal uterine and vaginal bleeding, unspecified: Secondary | ICD-10-CM

## 2020-03-13 ENCOUNTER — Other Ambulatory Visit: Payer: Self-pay

## 2020-03-13 ENCOUNTER — Ambulatory Visit
Admission: RE | Admit: 2020-03-13 | Discharge: 2020-03-13 | Disposition: A | Discharge status: Home / Self Care | Payer: No Typology Code available for payment source | Source: Ambulatory Visit | Attending: Physician Assistant | Admitting: Physician Assistant

## 2020-03-13 DIAGNOSIS — N939 Abnormal uterine and vaginal bleeding, unspecified: Secondary | ICD-10-CM

## 2020-07-18 ENCOUNTER — Other Ambulatory Visit: Payer: Self-pay | Admitting: Physician Assistant

## 2020-07-18 DIAGNOSIS — Z1231 Encounter for screening mammogram for malignant neoplasm of breast: Secondary | ICD-10-CM

## 2022-03-26 ENCOUNTER — Telehealth: Payer: Self-pay | Admitting: Physician Assistant

## 2022-03-26 NOTE — Telephone Encounter (Signed)
Scheduled appt per 3/1 referral. Pt is aware of appt date and time. Pt is aware to arrive 15 mins prior to appt time and to bring and updated insurance card. Pt is aware of appt location.   ?

## 2022-04-12 NOTE — Progress Notes (Unsigned)
[facility] [phone]   [phone]  CONSULT NOTE  REFERRING PHYSICIAN: [facility] in [place]:  Iron deficiency anemia  HPI [name] is a 52 y.o. female the past medical history significant for fibromyalgia, knee pain, anxiety, degenerative disc disease, hypothyroidism, nephrolithiasis, allergies, and cervical lesion referred to the clinic for iron deficiency anemia.  The patient recently had a follow-up appointment with her PCP on 03/23/2022.  She had repeat blood work performed at her wellness visit on 03/23/2022 which showed stable microcytic anemia with a hemoglobin of 9.3, low MCV at 68, and low ferritin at 5.  She is referred to the clinic for further evaluation regarding these findings.  Per their note, the patient has a history of iron deficiency anemia secondary to abnormal uterine bleeding with the onset of February 2022.  The patient previously was seen by [facility].  She had a cervical mass that was biopsied and was benign.  Her lowest hemoglobin was in the spring 2023 at 5.4 and she required 2 units of blood.  She now is followed by GYN in [place].  She is scheduled to see them back for follow-up visitin 05/23/22.  The patient mentions to me that she is no longer having abnormal uterine bleeding/menstrual bleeding.  She states her last menstrual cycle was 1 to 2 years ago.  The patient is 52 years old.  She has never had a colonoscopy.  She has not established with a gastroenterologist.  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  She is taking significant amount of ibuprofen on a daily basis.  She states she sometimes takes as much as 800 mg 4 times daily.  She states she at minimum will take 4 tablets (800 mg) once a day.  She does have an orthopedic provider at  [facility].   I have labs dating back to 05-23-2007.  It peers that the patient's anemia did start sometime in the last few years.  Her hemoglobin was normal in 2016-05-22.  Then her next set of labs is from 2020-05-22 and 05-22-2021 showing anemia.  She is not taking any iron supplements due to intolerance.  She states even with low-dose iron that she will experience nausea and vomiting.  She has never required an iron infusion before.  The patient is accompanied by her friend/mother figure today.  The patient's mother passed away in 05/23/07.  Patient is feeling well today besides fatigue.  She also has some dyspnea on exertion.  She occasionally gets lightheaded.  She is not taking any blood thinners. She denies any other abnormal bleeding or bruising including gingival bleeding, hemoptysis, hematemesis, melena, hematochezia, or hematuria. Denies any fever, chills, or night sweats.  She states she weighs 285 pounds at her PCPs office last month but she is weighing 277 pounds on our scales today.  The patient does feel like she has lost weight and her clothing is "falling off".  Although she is unable to quantify the amount and timeframe.  She denies any abdominal pain or changes in her bowel habits.     She denies any history of any bariatric surgery.  She does not practice any particular dietary restrictions such as being a vegan or vegetarian. Denies any history of kidney disease.    Patient's mother had fibromyalgia as well as breast cancer.  The  patient's father had heart disease and passed away in the 5s.  The patient does not know her siblings medical history.  Patient used to work in [employer] (medical).  Per her PCPs note she is applying for disability.  She is divorced.  She has 1 child.  Denies any drug, alcohol, or tobacco use.   HPI  Past Medical History:  Diagnosis Date   Allergy    Anxiety    reactive    Depression    Fibromyalgia    GERD (gastroesophageal reflux disease)    History of kidney stones    History  of nephrolithiasis 11/2008   under urology care uric acid rx ?   Hypothyroid    PONV (postoperative nausea and vomiting)    Varicose veins    Ventral hernia     Past Surgical History:  Procedure Laterality Date   CARPAL TUNNEL RELEASE     CHOLECYSTECTOMY     FOOT SURGERY     INSERTION OF MESH N/A 09/24/2016   Procedure: INSERTION OF MESH;  Surgeon: Jovita Kussmaul, MD;  Location: [facility];  Service: General;  Laterality: N/A;   LITHOTRIPSY     twice by Dr. Gaynelle Arabian   rt knee surgery  08/1987   teeth extracted     on top for decay   VENTRAL HERNIA REPAIR  09/24/2016   VENTRAL HERNIA REPAIR N/A 09/24/2016   Procedure: VENTRAL HERNIA REPAIR WITH MESH;  Surgeon: Jovita Kussmaul, MD;  Location: [facility];  Service: General;  Laterality: N/A;    Family History  Problem Relation Age of Onset   Breast cancer Mother        deceased 87   Osteoarthritis Mother    ADD / ADHD Son    Heart disease Father    Pneumonia Father        Deceased, 41   Healthy Sister     Social History Social History   Tobacco Use   Smoking status: Never   Smokeless tobacco: Never  Vaping Use   Vaping Use: Never used  Substance Use Topics   Alcohol use: No    Alcohol/week: 0.0 standard drinks of alcohol   Drug use: No    Allergies  Allergen Reactions   Nalbuphine Other (See Comments)    (Nubain)--entire body hot/lightheaded dizziness w/blacked out   Ondansetron Other (See Comments)    (zofran)--pt is unable to recall reaction type    Current Outpatient Medications  Medication Sig Dispense Refill   Aspirin-Caffeine (BAYER BACK & BODY) 500-32.5 MG TABS Take 2 tablets by mouth daily as needed (for pain).      busPIRone (BUSPAR) 10 MG tablet      cetirizine (ZYRTEC) 10 MG tablet Take 10 mg by mouth at bedtime.      cimetidine (TAGAMET) 200 MG tablet Take 200 mg by mouth daily before breakfast.     DULoxetine (CYMBALTA) 60 MG capsule TAKE ONE CAPSULE BY MOUTH ONCE DAILY. (Patient taking differently:  Take 60 mg by mouth 2 (two) times daily.) 30 capsule 0   erythromycin ophthalmic ointment Place 1 application into the left eye 2 (two) times daily.     gabapentin (NEURONTIN) 600 MG tablet Take 600 mg by mouth 3 (three) times daily.     HYDROcodone-acetaminophen (NORCO/VICODIN) 5-325 MG tablet Take 1-2 tablets by mouth every 4 (four) hours as needed for moderate pain or severe pain. 20 tablet 0   ibuprofen (ADVIL,MOTRIN) 200 MG tablet Take 800 mg by mouth every 8 (eight)  hours as needed (for pain.).      levothyroxine (SYNTHROID, LEVOTHROID) 137 MCG tablet Take 137 mcg by mouth daily before breakfast.     SUMAtriptan (IMITREX) 100 MG tablet Take 100 mg by mouth every 2 (two) hours as needed for migraine. May repeat in 2 hours if headache persists or recurs.     No current facility-administered medications for this visit.    REVIEW OF SYSTEMS:   Review of Systems  Constitutional: Positive for fatigue.  Negative for appetite change, chills, fever and unexpected weight change.  HENT: Negative for mouth sores, nosebleeds, sore throat and trouble swallowing.   Eyes: Negative for eye problems and icterus.  Respiratory: Positive for dyspnea on exertion. Negative for cough, hemoptysis,  and wheezing.   Cardiovascular: Negative for chest pain and leg swelling.  Gastrointestinal: Negative for abdominal pain, constipation, diarrhea, nausea and vomiting.  Genitourinary: Negative for bladder incontinence, difficulty urinating, dysuria, frequency and hematuria.   Musculoskeletal: Positive for multiple joint aches. Negative for gait problem, neck pain and neck stiffness.  Skin: Negative for itching and rash.  Neurological: Positive for lightheadedness Negative for dizziness, extremity weakness, gait problem, headaches, and seizures.  Hematological: Negative for adenopathy. Does not bruise/bleed easily.  Psychiatric/Behavioral: Negative for confusion, depression and sleep disturbance. The patient is not  nervous/anxious.     PHYSICAL EXAMINATION:  Blood pressure 128/70, pulse 73, temperature 98 F (36.7 C), temperature source Temporal, resp. rate 19, height 5\' 8"  (1.727 m), weight 277 lb 3.2 oz (125.7 kg), SpO2 100 %.  ECOG PERFORMANCE STATUS: 1  Physical Exam  Constitutional: Oriented to person, place, and time and well-developed, well-nourished, and in no distress.  HENT:  Head: Normocephalic and atraumatic.  Mouth/Throat: Oropharynx is clear and moist. No oropharyngeal exudate.  Eyes: Conjunctivae are normal. Right eye exhibits no discharge. Left eye exhibits no discharge. No scleral icterus.  Neck: Normal range of motion. Neck supple.  Cardiovascular: Normal rate, regular rhythm, normal heart sounds and intact distal pulses.   Pulmonary/Chest: Effort normal and breath sounds normal. No respiratory distress. No wheezes. No rales.  Abdominal: Soft. Bowel sounds are normal. Exhibits no distension and no mass. There is no tenderness.  Musculoskeletal: Normal range of motion. Exhibits no edema.  Lymphadenopathy:    No cervical adenopathy.  Neurological: Alert and oriented to person, place, and time. Exhibits normal muscle tone. Gait normal. Coordination normal.  Skin: Skin is warm and dry. No rash noted. Not diaphoretic. No erythema. No pallor.  Psychiatric: Mood, memory and judgment normal.  Vitals reviewed.  LABORATORY DATA: Lab Results  Component Value Date   WBC 7.8 04/14/2022   HGB 10.6 (L) 04/14/2022   HCT 35.5 (L) 04/14/2022   MCV 68.0 (L) 04/14/2022   PLT 338 04/14/2022      Chemistry      Component Value Date/Time   NA 139 04/14/2022 1222   K 3.8 04/14/2022 1222   CL 105 04/14/2022 1222   CO2 29 04/14/2022 1222   BUN 18 04/14/2022 1222   CREATININE 0.93 04/14/2022 1222      Component Value Date/Time   CALCIUM 9.0 04/14/2022 1222   ALKPHOS 147 (H) 04/14/2022 1222   AST 10 (L) 04/14/2022 1222   ALT 10 04/14/2022 1222   BILITOT 0.6 04/14/2022 1222        RADIOGRAPHIC STUDIES: No results found.  ASSESSMENT: Is a very pleasant 52 year old Caucasian female referred to the clinic for iron deficiency anemia secondary to abnormal uterine bleeding.  The patient  had repeat CBC, iron studies, ferritin, 123456, folic acid, and SPEP performed today.  Her labs today demonstrate microcytic anemia and iron deficiency.  Her other lab studies are pending at this time.  We will arrange for IV iron with Venofer 300 mg weekly x 3 starting next week.   The patient has a history of intolerance to oral iron supplements.    She was also given a handout on iron rich food and she was strongly encouraged to increase her dietary intake of iron rich food.  Will see her back for follow-up visit in 3 months for evaluation and repeat blood work and for consideration of repeat IV iron at that time.  She will continue to follow with GYN to follow-up on her cervical mass.  She follows with GYN in [place].  I had a lengthy discussion with the patient that any new onset iron deficiency in adults without any obvious source of bleeding should  be assessed for occult GI blood loss.  Additionally, the patient is 52 years old and she is overdue for her screening colonoscopy.  She also has been taking significant amounts of ibuprofen daily for some time due to chronic joint pain and fibromyalgia.  She states that she takes a minimum of 800 mg of ibuprofen once a day.  She states that sometimes she may take 800 mg 4 times daily.  I discussed that ibuprofen may cause stomach ulcers.  Additionally NSAIDs can cause renal insufficiency, although the patient has acceptable renal function at this time.  I would strongly encourage the patient to cut back/stop her ibuprofen use and use Tylenol instead at this time.  I also discussed the importance of managing the underlying problem which is likely secondary to her degenerative joint disease.  She will follow-up with [facility] for this.  She  states that her knees are particularly painful.  Discussed that weight loss can help put less stress/strain on her joints.  Of course, it is difficult to exercise with chronic joint pain.  Therefore, we discussed other strategies such as swimming or stationary bikes for weight loss which are usually less taxing on the joints.    The patient voices understanding of current disease status and treatment options and is in agreement with the current care plan.  All questions were answered. The patient knows to call the clinic with any problems, questions or concerns. We can certainly see the patient much sooner if necessary.  Thank you so much for allowing me to participate in the care of Sabrena S Lietz. I will continue to follow up the patient with you and assist in her care.  Disclaimer: This note was dictated with voice recognition software. Similar sounding words can inadvertently be transcribed and may not be corrected upon review.   Shaquasha Gerstel L Milan Perkins April 14, 2022, 2:08 PM  ADDENDUM: Hematology/Oncology Attending:  I had a face to face encounter with the patient today.  I reviewed her records, lab and recommended her care plan.  This is a very pleasant 52 years old African-American female with multiple medical problems including fibromyalgia, anxiety, degenerative disc disease, hypothyroidism as well as cervical lesion who was referred to [place] today for evaluation of iron deficiency anemia.  The patient was seen by her primary care provider last months for routine evaluation and she had blood work that showed microcytic anemia with hemoglobin of 9.3 with low MCV of 68 and ferritin level of 5.  She had episodes of anemia in the past and this  spring 2023 her hemoglobin was down to 5.4 secondary to menorrhagia.  She received PRBCs transfusion at that time.  She started oral iron tablet but she is not tolerating it fairly well. Will order several studies today including repeat CBC which  showed hemoglobin of 10.6 and hematocrit 35.5% with MCV of 68.  Iron studies showed low serum iron of 25 with iron saturation of 4% and TIBC of 577.  Her vitamin B12 level and serum folate are normal.  Ferritin level is still pending as well as SPEP with immunofixation. I will arrange for the patient to receive iron infusion with Venofer 300 Mg IV weekly for 3 weeks at the [facility]. We will see her back for follow-up visit in 3 months for evaluation and repeat blood work. The patient was also referred to gastroenterology for evaluation and to rule out any gastrointestinal bleeding. She was advised to call immediately if she has any other concerning symptoms in the interval. The total time spent in the appointment was 60 minutes. Disclaimer: This note was dictated with voice recognition software. Similar sounding words can inadvertently be transcribed and may be missed upon review. Eilleen Kempf, MD

## 2022-04-13 ENCOUNTER — Other Ambulatory Visit: Payer: Self-pay | Admitting: Physician Assistant

## 2022-04-13 DIAGNOSIS — D509 Iron deficiency anemia, unspecified: Secondary | ICD-10-CM

## 2022-04-14 ENCOUNTER — Encounter: Payer: Self-pay | Admitting: Physician Assistant

## 2022-04-14 ENCOUNTER — Telehealth: Payer: Self-pay | Admitting: Physician Assistant

## 2022-04-14 ENCOUNTER — Inpatient Hospital Stay: Payer: Medicaid Other

## 2022-04-14 ENCOUNTER — Inpatient Hospital Stay: Payer: Medicaid Other | Attending: Physician Assistant | Admitting: Physician Assistant

## 2022-04-14 VITALS — BP 128/70 | HR 73 | Temp 98.0°F | Resp 19 | Ht 68.0 in | Wt 277.2 lb

## 2022-04-14 DIAGNOSIS — D509 Iron deficiency anemia, unspecified: Secondary | ICD-10-CM | POA: Diagnosis not present

## 2022-04-14 DIAGNOSIS — R0609 Other forms of dyspnea: Secondary | ICD-10-CM | POA: Diagnosis not present

## 2022-04-14 DIAGNOSIS — Z803 Family history of malignant neoplasm of breast: Secondary | ICD-10-CM | POA: Diagnosis not present

## 2022-04-14 DIAGNOSIS — E538 Deficiency of other specified B group vitamins: Secondary | ICD-10-CM

## 2022-04-14 DIAGNOSIS — Z8249 Family history of ischemic heart disease and other diseases of the circulatory system: Secondary | ICD-10-CM | POA: Diagnosis not present

## 2022-04-14 DIAGNOSIS — R634 Abnormal weight loss: Secondary | ICD-10-CM | POA: Diagnosis not present

## 2022-04-14 DIAGNOSIS — E611 Iron deficiency: Secondary | ICD-10-CM

## 2022-04-14 DIAGNOSIS — R5383 Other fatigue: Secondary | ICD-10-CM | POA: Diagnosis not present

## 2022-04-14 DIAGNOSIS — R42 Dizziness and giddiness: Secondary | ICD-10-CM | POA: Diagnosis not present

## 2022-04-14 LAB — CMP (CANCER CENTER ONLY)
ALT: 10 U/L (ref 0–44)
AST: 10 U/L — ABNORMAL LOW (ref 15–41)
Albumin: 4.1 g/dL (ref 3.5–5.0)
Alkaline Phosphatase: 147 U/L — ABNORMAL HIGH (ref 38–126)
Anion gap: 5 (ref 5–15)
BUN: 18 mg/dL (ref 6–20)
CO2: 29 mmol/L (ref 22–32)
Calcium: 9 mg/dL (ref 8.9–10.3)
Chloride: 105 mmol/L (ref 98–111)
Creatinine: 0.93 mg/dL (ref 0.44–1.00)
GFR, Estimated: >60 mL/min (ref >60)
Glucose, Bld: 133 mg/dL — ABNORMAL HIGH (ref 70–99)
Potassium: 3.8 mmol/L (ref 3.5–5.1)
Sodium: 139 mmol/L (ref 135–145)
Total Bilirubin: 0.6 mg/dL (ref 0.3–1.2)
Total Protein: 8 g/dL (ref 6.5–8.1)

## 2022-04-14 LAB — CBC WITH DIFFERENTIAL (CANCER CENTER ONLY)
Abs Immature Granulocytes: 0.03 10*3/uL (ref 0.00–0.07)
Basophils Absolute: 0.1 10*3/uL (ref 0.0–0.1)
Basophils Relative: 1 %
Eosinophils Absolute: 0.2 10*3/uL (ref 0.0–0.5)
Eosinophils Relative: 3 %
HCT: 35.5 % — ABNORMAL LOW (ref 36.0–46.0)
Hemoglobin: 10.6 g/dL — ABNORMAL LOW (ref 12.0–15.0)
Immature Granulocytes: 0 %
Lymphocytes Relative: 25 %
Lymphs Abs: 1.9 10*3/uL (ref 0.7–4.0)
MCH: 20.3 pg — ABNORMAL LOW (ref 26.0–34.0)
MCHC: 29.9 g/dL — ABNORMAL LOW (ref 30.0–36.0)
MCV: 68 fL — ABNORMAL LOW (ref 80.0–100.0)
Monocytes Absolute: 0.5 10*3/uL (ref 0.1–1.0)
Monocytes Relative: 6 %
Neutro Abs: 5 10*3/uL (ref 1.7–7.7)
Neutrophils Relative %: 65 %
Platelet Count: 338 10*3/uL (ref 150–400)
RBC: 5.22 MIL/uL — ABNORMAL HIGH (ref 3.87–5.11)
RDW: 19.4 % — ABNORMAL HIGH (ref 11.5–15.5)
WBC Count: 7.8 10*3/uL (ref 4.0–10.5)
nRBC: 0 % (ref 0.0–0.2)

## 2022-04-14 LAB — VITAMIN B12: Vitamin B-12: 266 pg/mL (ref 180–914)

## 2022-04-14 LAB — IRON AND IRON BINDING CAPACITY (CC-WL,HP ONLY)
Iron: 25 ug/dL — ABNORMAL LOW (ref 28–170)
Saturation Ratios: 4 % — ABNORMAL LOW (ref 10.4–31.8)
TIBC: 577 ug/dL — ABNORMAL HIGH (ref 250–450)
UIBC: 552 ug/dL — ABNORMAL HIGH (ref 148–442)

## 2022-04-14 LAB — FOLATE: Folate: 20.2 ng/mL (ref >5.9)

## 2022-04-14 LAB — FERRITIN: Ferritin: 3 ng/mL — ABNORMAL LOW (ref 11–307)

## 2022-04-14 NOTE — Telephone Encounter (Signed)
I attempted to call the patient to encourage her to take a multivitamin with B12 or a OTC b12 supplement. Her B12 is on the low end of normal. However, she does not have a voicemail set up. I actually discussed the importance of having voicemail set up for calls for her upcoming GI referral at her appointment today. I will send her a mychart message with the information.

## 2022-04-16 LAB — PROTEIN ELECTROPHORESIS, SERUM, WITH REFLEX
A/G Ratio: 0.9 (ref 0.7–1.7)
Albumin ELP: 3.6 g/dL (ref 2.9–4.4)
Alpha-1-Globulin: 0.3 g/dL (ref 0.0–0.4)
Alpha-2-Globulin: 0.8 g/dL (ref 0.4–1.0)
Beta Globulin: 1.3 g/dL (ref 0.7–1.3)
Gamma Globulin: 1.9 g/dL — ABNORMAL HIGH (ref 0.4–1.8)
Globulin, Total: 4.2 g/dL — ABNORMAL HIGH (ref 2.2–3.9)
Total Protein ELP: 7.8 g/dL (ref 6.0–8.5)

## 2022-04-28 ENCOUNTER — Inpatient Hospital Stay: Payer: Medicaid Other | Attending: Physician Assistant

## 2022-04-28 ENCOUNTER — Other Ambulatory Visit: Payer: Self-pay

## 2022-04-28 VITALS — BP 139/77 | HR 60 | Temp 98.4°F | Resp 14

## 2022-04-28 DIAGNOSIS — D509 Iron deficiency anemia, unspecified: Secondary | ICD-10-CM | POA: Insufficient documentation

## 2022-04-28 DIAGNOSIS — E611 Iron deficiency: Secondary | ICD-10-CM

## 2022-04-28 MED ORDER — ACETAMINOPHEN 325 MG PO TABS
650.0000 mg | ORAL_TABLET | Freq: Once | ORAL | Status: AC
  Administered 2022-04-28: 650 mg via ORAL
  Filled 2022-04-28: qty 2

## 2022-04-28 MED ORDER — SODIUM CHLORIDE 0.9 % IV SOLN
300.0000 mg | Freq: Once | INTRAVENOUS | Status: AC
  Administered 2022-04-28: 300 mg via INTRAVENOUS
  Filled 2022-04-28: qty 300

## 2022-04-28 MED ORDER — SODIUM CHLORIDE 0.9 % IV SOLN: Freq: Once | INTRAVENOUS | Status: AC

## 2022-04-28 MED ORDER — DIPHENHYDRAMINE HCL 25 MG PO CAPS
25.0000 mg | ORAL_CAPSULE | Freq: Once | ORAL | Status: AC
  Administered 2022-04-28: 25 mg via ORAL
  Filled 2022-04-28: qty 1

## 2022-04-28 NOTE — Progress Notes (Signed)
Pt observed for 30 minutes post Venofer infusion. Pt tolerated trtmt well w/out incident. VSS at discharge.  Ambulatory to lobby.   

## 2022-04-28 NOTE — Patient Instructions (Signed)

## 2022-05-05 ENCOUNTER — Other Ambulatory Visit: Payer: Self-pay

## 2022-05-05 ENCOUNTER — Inpatient Hospital Stay: Payer: Medicaid Other

## 2022-05-05 VITALS — BP 136/72 | HR 64 | Temp 98.6°F | Resp 18 | Wt 282.1 lb

## 2022-05-05 DIAGNOSIS — E611 Iron deficiency: Secondary | ICD-10-CM

## 2022-05-05 DIAGNOSIS — D509 Iron deficiency anemia, unspecified: Secondary | ICD-10-CM | POA: Diagnosis not present

## 2022-05-05 MED ORDER — ACETAMINOPHEN 325 MG PO TABS
650.0000 mg | ORAL_TABLET | Freq: Once | ORAL | Status: AC
  Administered 2022-05-05: 650 mg via ORAL
  Filled 2022-05-05: qty 2

## 2022-05-05 MED ORDER — DIPHENHYDRAMINE HCL 25 MG PO CAPS
25.0000 mg | ORAL_CAPSULE | Freq: Once | ORAL | Status: AC
  Administered 2022-05-05: 25 mg via ORAL
  Filled 2022-05-05: qty 1

## 2022-05-05 MED ORDER — SODIUM CHLORIDE 0.9 % IV SOLN: Freq: Once | INTRAVENOUS | Status: AC

## 2022-05-05 MED ORDER — SODIUM CHLORIDE 0.9 % IV SOLN
300.0000 mg | Freq: Once | INTRAVENOUS | Status: AC
  Administered 2022-05-05: 300 mg via INTRAVENOUS
  Filled 2022-05-05: qty 300

## 2022-05-05 NOTE — Patient Instructions (Signed)

## 2022-05-05 NOTE — Progress Notes (Signed)
Pt declined to stay for 30 minute observation period post iron infusion. VSS at time of discharge.

## 2022-05-07 ENCOUNTER — Encounter (HOSPITAL_COMMUNITY): Payer: Self-pay

## 2022-05-12 ENCOUNTER — Inpatient Hospital Stay: Payer: Medicaid Other

## 2022-05-12 ENCOUNTER — Other Ambulatory Visit: Payer: Self-pay

## 2022-05-12 VITALS — BP 142/64 | HR 56 | Temp 98.3°F | Resp 17

## 2022-05-12 DIAGNOSIS — E611 Iron deficiency: Secondary | ICD-10-CM

## 2022-05-12 DIAGNOSIS — D509 Iron deficiency anemia, unspecified: Secondary | ICD-10-CM | POA: Diagnosis not present

## 2022-05-12 MED ORDER — DIPHENHYDRAMINE HCL 25 MG PO CAPS
25.0000 mg | ORAL_CAPSULE | Freq: Once | ORAL | Status: AC
  Administered 2022-05-12: 25 mg via ORAL
  Filled 2022-05-12: qty 1

## 2022-05-12 MED ORDER — ACETAMINOPHEN 325 MG PO TABS
650.0000 mg | ORAL_TABLET | Freq: Once | ORAL | Status: AC
  Administered 2022-05-12: 650 mg via ORAL
  Filled 2022-05-12: qty 2

## 2022-05-12 MED ORDER — SODIUM CHLORIDE 0.9 % IV SOLN
300.0000 mg | Freq: Once | INTRAVENOUS | Status: AC
  Administered 2022-05-12: 300 mg via INTRAVENOUS
  Filled 2022-05-12: qty 300

## 2022-05-12 MED ORDER — SODIUM CHLORIDE 0.9 % IV SOLN: Freq: Once | INTRAVENOUS | Status: AC

## 2022-05-12 NOTE — Patient Instructions (Signed)

## 2022-05-20 ENCOUNTER — Ambulatory Visit (HOSPITAL_COMMUNITY): Payer: Self-pay | Admitting: Student

## 2022-06-08 ENCOUNTER — Telehealth: Payer: Self-pay

## 2022-06-08 NOTE — Telephone Encounter (Signed)
Patient called stating that she is getting her knee replaced and wanted to know if you though she should get a tetanus shot

## 2022-06-09 NOTE — Telephone Encounter (Signed)
I attached the wrong patient I will get this corrected

## 2022-06-23 ENCOUNTER — Ambulatory Visit (INDEPENDENT_AMBULATORY_CARE_PROVIDER_SITE_OTHER): Payer: Medicaid Other | Admitting: Gastroenterology

## 2022-06-23 ENCOUNTER — Encounter: Payer: Self-pay | Admitting: Gastroenterology

## 2022-06-23 VITALS — BP 130/78 | HR 59 | Ht 69.0 in | Wt 291.0 lb

## 2022-06-23 DIAGNOSIS — Z1211 Encounter for screening for malignant neoplasm of colon: Secondary | ICD-10-CM

## 2022-06-23 DIAGNOSIS — D509 Iron deficiency anemia, unspecified: Secondary | ICD-10-CM

## 2022-06-23 DIAGNOSIS — M797 Fibromyalgia: Secondary | ICD-10-CM

## 2022-06-23 MED ORDER — NA SULFATE-K SULFATE-MG SULF 17.5-3.13-1.6 GM/177ML PO SOLN: 1.0000 | Freq: Once | ORAL | 0 refills | Status: AC

## 2022-06-23 NOTE — Progress Notes (Signed)
Chief Complaint: Iron deficiency anemia, CRC screening   Referring Provider:     [name], PA-C   HPI:     [name] is a 52 y.o. female with a history of fibromyalgia, anxiety, depression, cholecystectomy, DDD, hypothyroidism, referred to the Gastroenterology Clinic for evaluation of iron deficiency anemia.  Recently evaluated in the Hematology Clinic on 04/14/2022 for IDA with Hgb 9.3, MCV 68, ferritin 5.  Does have a history of abnormal uterine bleeding starting in 02/2020.  Was evaluated in the OB/GYN clinic and had a cervical mass that was biopsied and benign. Per patient, no plan for surgery to remove that and b/l ovarian cysts.   Hgb nadir of 5.3 in 2023; transfused 2 unit PRBCs.  - 04/14/2022: H/H 10.6/35.5, MCV/RDW 68/19.4.  Ferritin 3, iron 25, TIBC 577, sat 4%.  B12 266, folate normal.  Was treated with has received IV iron x 3   Otherwise no overt GI bleeding.   She does take Motrin regularly for arthralgias, sometimes as much is 800 mg 4 times daily.  No previous EGD or colonoscopy.  No known family history of CRC, GI malignancy, liver disease, pancreatic disease, or IBD.     Past Medical History:  Diagnosis Date   Allergy    Anxiety    reactive    Depression    Fibromyalgia    GERD (gastroesophageal reflux disease)    History of kidney stones    History of nephrolithiasis 11/2008   under urology care uric acid rx ?   Hypothyroid    PONV (postoperative nausea and vomiting)    Varicose veins    Ventral hernia      Past Surgical History:  Procedure Laterality Date   CARPAL TUNNEL RELEASE     CHOLECYSTECTOMY     FOOT SURGERY     INSERTION OF MESH N/A 09/24/2016   Procedure: INSERTION OF MESH;  Surgeon: Griselda Miner, MD;  Location: [facility];  Service: General;  Laterality: N/A;   LITHOTRIPSY     twice by Dr. Patsi Sears   rt knee surgery  08/1987   teeth extracted     on top for decay   VENTRAL HERNIA REPAIR  09/24/2016    VENTRAL HERNIA REPAIR N/A 09/24/2016   Procedure: VENTRAL HERNIA REPAIR WITH MESH;  Surgeon: Griselda Miner, MD;  Location: [facility] OR;  Service: General;  Laterality: N/A;   Family History  Problem Relation Age of Onset   Breast cancer Mother        deceased 57   Osteoarthritis Mother    ADD / ADHD Son    Heart disease Father    Pneumonia Father        Deceased, 47   Healthy Sister    Social History   Tobacco Use   Smoking status: Never   Smokeless tobacco: Never  Vaping Use   Vaping Use: Never used  Substance Use Topics   Alcohol use: No    Alcohol/week: 0.0 standard drinks of alcohol   Drug use: No   Current Outpatient Medications  Medication Sig Dispense Refill   busPIRone (BUSPAR) 10 MG tablet      cetirizine (ZYRTEC) 10 MG tablet Take 10 mg by mouth at bedtime.      cimetidine (TAGAMET) 200 MG tablet Take 200 mg by mouth daily as needed.     DULoxetine (CYMBALTA) 60 MG capsule TAKE ONE CAPSULE BY MOUTH ONCE  DAILY. (Patient taking differently: Take 60 mg by mouth 2 (two) times daily.) 30 capsule 0   gabapentin (NEURONTIN) 600 MG tablet Take 600 mg by mouth 3 (three) times daily.     levothyroxine (SYNTHROID, LEVOTHROID) 137 MCG tablet Take 137 mcg by mouth daily before breakfast.     SUMAtriptan (IMITREX) 100 MG tablet Take 100 mg by mouth every 2 (two) hours as needed for migraine. May repeat in 2 hours if headache persists or recurs.     No current facility-administered medications for this visit.   Allergies  Allergen Reactions   Nalbuphine Other (See Comments)    (Nubain)--entire body hot/lightheaded dizziness w/blacked out   Ondansetron Other (See Comments)    (zofran)--pt is unable to recall reaction type     Review of Systems: All systems reviewed and negative except where noted in HPI.     Physical Exam:    Wt Readings from Last 3 Encounters:  06/23/22 291 lb (132 kg)  05/05/22 282 lb 1.6 oz (128 kg)  04/14/22 277 lb 3.2 oz (125.7 kg)    BP 130/78    Pulse (!) 59   Ht 5\' 9"  (1.753 m)   Wt 291 lb (132 kg)   BMI 42.97 kg/m  Constitutional:  Pleasant, in no acute distress. Psychiatric: Normal mood and affect. Behavior is normal. Neurological: Alert and oriented to person place and time. Skin: Skin is warm and dry. No rashes noted.   ASSESSMENT AND PLAN;   1) Iron deficiency anemia Discussed DDx for IDA to include GI and non-GI etiologies.  Recently treated with IV iron and has had PRBC transfusion last year.  Otherwise no overt GI bleeding.  Discussed occult, obscure GI bleed, malabsorption, with plan for the following: - EGD with duodenal biopsies and colonoscopy to evaluate for mucosal/luminal pathology - If EGD/colonoscopy unremarkable, plan for VCE - If all unrevealing, can follow-up with GYN - Continue follow-up in the Hematology clinic  2) Colon cancer screening Due for age-appropriate CRC screening - Planning colonoscopy as above  3) Arthralgias 4) Fibromyalgia Takes NSAIDs regularly.  Not currently taking any PPI or H2 blockers.  Evaluate for PUD, gastritis, etc. with EGD  The indications, risks, and benefits of EGD and colonoscopy were explained to the patient in detail. Risks include but are not limited to bleeding, perforation, adverse reaction to medications, and cardiopulmonary compromise. Sequelae include but are not limited to the possibility of surgery, hospitalization, and mortality. The patient verbalized understanding and wished to proceed. All questions answered, referred to scheduler and bowel prep ordered. Further recommendations pending results of the exam.      Shellia Cleverly, DO, FACG  06/23/2022, 1:59 PM   [name], PA-C

## 2022-06-23 NOTE — Patient Instructions (Addendum)
You have been scheduled for an endoscopy and colonoscopy. Please follow the written instructions given to you at your visit today. Please pick up your prep supplies at the pharmacy within the next 1-3 days. If you use inhalers (even only as needed), please bring them with you on the day of your procedure.  _______________________________________________________  If your blood pressure at your visit was 140/90 or greater, please contact your primary care physician to follow up on this.  _______________________________________________________  If you are age 29 or older, your body mass index should be between 23-30. Your Body mass index is 42.97 kg/m. If this is out of the aforementioned range listed, please consider follow up with your Primary Care Provider.  If you are age 34 or younger, your body mass index should be between 19-25. Your Body mass index is 42.97 kg/m. If this is out of the aformentioned range listed, please consider follow up with your Primary Care Provider.   __________________________________________________________  The [facility] providers would like to encourage you to use [facility] to communicate with providers for non-urgent requests or questions.  Due to long hold times on the telephone, sending your provider a message by [facility] may be a faster and more efficient way to get a response.  Please allow 48 business hours for a response.  Please remember that this is for non-urgent requests.   Due to recent changes in healthcare laws, you may see the results of your imaging and laboratory studies on MyChart before your provider has had a chance to review them.  We understand that in some cases there may be results that are confusing or concerning to you. Not all laboratory results come back in the same time frame and the provider may be waiting for multiple results in order to interpret others.  Please give [place] 48 hours in order for your provider to thoroughly review all the  results before contacting the office for clarification of your results.     Thank you for choosing me and [facility].  Vito Cirigliano, D.O.

## 2022-07-12 NOTE — Progress Notes (Unsigned)
[facility] OFFICE PROGRESS NOTE  Maria Peak, PA-C [address] [place] [address]  DIAGNOSIS: iron deficiency anemia and borderline low B12  PRIOR THERAPY: Intolerance to oral iron supplements   CURRENT THERAPY:  1) IV Iron PRN with Venofer 300 mg weekly x3, most recent dose on 05/12/22 2) OTC B12 supplements, will start in a few days   INTERVAL HISTORY: [name] 52 y.o. female returns to the clinic today for a follow up visit. She established care on 04/14/22. She has intolerance to oral iron supplements with nausea and vomiting. Therefore, the patient underwent IV with venofer 300 mg weekly x3. She tolerated this well.  She has been eating food rich in iron.  I sent a MyChart message to the patient about starting B12 supplements.  She was unaware she should be starting B12 and will start this in the next few days.  He did note some improvement in her energy and fatigue after the second and third iron infusion.  She reports she does not feel as fatigued/weak as before but does not feel she is where she should be.  She has some dyspnea on exertion.  Her lightheadedness has improved.  She denies any other abnormal bleeding or bruising including gingival bleeding, hemoptysis, hematemesis, melena, hematochezia, or hematuria.  She had never had a colonoscopy. She established with Dr. Angelina Hull from GI. She was taking a large amount NSAIDs regularly.  Since I last saw her in March, she stopped taking NSAIDs and has been taking Tylenol instead.  She is expected to have a colonoscopy on 07/26/22. If unremarkable, he recommends VCE per his note. If the VCE is unremarkable, he recommends follow up with GYN.  The patient has a history of iron deficiency anemia secondary to abnormal uterine bleeding with the onset of February 2022.  The patient previously was seen by [facility].  She had a cervical mass that was biopsied and was benign.  Patient also mentions that she has  2 ovarian cyst.  She is worried about malignancy as her mother had breast cancer.  She is hopeful that GYN would consider surgery.  She is wondering if she can have a referral to the [facility] gynecology practice as she has 2 providers in mind that she trust that she is hopeful to see for second opinion.  Luckily, she mentions to me that she is no longer having any abnormal uterine/vaginal bleeding.  He also has some ongoing issues with arthritis.  On 07/20/2022 she is expected to have laparoscopic surgery of her shoulder for a rotator cuff tear, bone spur, and cyst in her joint.  She also states she is to receive a nerve block.  She follows with Dr. Rennis Hull.   She also mentions her Synthroid dose was increased due to hypothyroidism.  She is here for evaluation and repeat blood work.     MEDICAL HISTORY: Past Medical History:  Diagnosis Date   Allergy    Anxiety    reactive    Depression    Fibromyalgia    GERD (gastroesophageal reflux disease)    History of kidney stones    History of nephrolithiasis 11/2008   under urology care uric acid rx ?   Hypothyroid    PONV (postoperative nausea and vomiting)    Varicose veins    Ventral hernia     ALLERGIES:  is allergic to nalbuphine and ondansetron.  MEDICATIONS:  Current Outpatient Medications  Medication Sig Dispense Refill  busPIRone (BUSPAR) 10 MG tablet      cetirizine (ZYRTEC) 10 MG tablet Take 10 mg by mouth at bedtime.      cimetidine (TAGAMET) 200 MG tablet Take 200 mg by mouth daily as needed.     DULoxetine (CYMBALTA) 60 MG capsule TAKE ONE CAPSULE BY MOUTH ONCE DAILY. (Patient taking differently: Take 60 mg by mouth 2 (two) times daily.) 30 capsule 0   gabapentin (NEURONTIN) 600 MG tablet Take 600 mg by mouth 3 (three) times daily.     levothyroxine (SYNTHROID, LEVOTHROID) 137 MCG tablet Take 137 mcg by mouth daily before breakfast.     SUMAtriptan (IMITREX) 100 MG tablet Take 100 mg by mouth every 2 (two) hours as needed for  migraine. May repeat in 2 hours if headache persists or recurs.     No current facility-administered medications for this visit.    SURGICAL HISTORY:  Past Surgical History:  Procedure Laterality Date   CARPAL TUNNEL RELEASE     CHOLECYSTECTOMY     FOOT SURGERY     INSERTION OF MESH N/A 09/24/2016   Procedure: INSERTION OF MESH;  Surgeon: Maria Miner, MD;  Location: [facility] OR;  Service: General;  Laterality: N/A;   LITHOTRIPSY     twice by Dr. Patsi Hull   rt knee surgery  08/1987   teeth extracted     on top for decay   VENTRAL HERNIA REPAIR  09/24/2016   VENTRAL HERNIA REPAIR N/A 09/24/2016   Procedure: VENTRAL HERNIA REPAIR WITH MESH;  Surgeon: Maria Miner, MD;  Location: [facility] OR;  Service: General;  Laterality: N/A;    REVIEW OF SYSTEMS:   Review of Systems  Constitutional: Positive for fatigue.  Negative for appetite change, chills, fever and unexpected weight change.  HENT: Negative for mouth sores, nosebleeds, sore throat and trouble swallowing.   Eyes: Negative for eye problems and icterus.  Respiratory: Positive for dyspnea on exertion. Negative for cough, hemoptysis,  and wheezing.   Cardiovascular: Negative for chest pain and leg swelling.  Gastrointestinal: Negative for abdominal pain, constipation, diarrhea, nausea and vomiting.  Genitourinary: Negative for bladder incontinence, difficulty urinating, dysuria, frequency and hematuria.   Musculoskeletal: Positive for multiple joint aches. Negative for gait problem, neck pain and neck stiffness.  Skin: Negative for itching and rash.  Neurological: Negative for dizziness, extremity weakness, gait problem, headaches, and seizures.  Hematological: Negative for adenopathy. Does not bruise/bleed easily.  Psychiatric/Behavioral: Negative for confusion, depression and sleep disturbance. The patient is not nervous/anxious.    PHYSICAL EXAMINATION:  There were no vitals taken for this visit.  ECOG PERFORMANCE STATUS:  1  Physical Exam  Constitutional: Oriented to person, place, and time and well-developed, well-nourished, and in no distress.   HENT:  Head: Normocephalic and atraumatic.  Mouth/Throat: Oropharynx is clear and moist. No oropharyngeal exudate.  Eyes: Conjunctivae are normal. Right eye exhibits no discharge. Left eye exhibits no discharge. No scleral icterus.  Neck: Normal range of motion. Neck supple.  Cardiovascular: Normal rate, regular rhythm, normal heart sounds and intact distal pulses.   Pulmonary/Chest: Effort normal and breath sounds normal. No respiratory distress. No wheezes. No rales.  Abdominal: Soft. Bowel sounds are normal. Exhibits no distension and no mass. There is no tenderness.  Musculoskeletal: Normal range of motion. Exhibits no edema.  Lymphadenopathy:    No cervical adenopathy.  Neurological: Alert and oriented to person, place, and time. Exhibits normal muscle tone. Gait normal. Coordination normal.  Skin: Skin is warm and  dry. No rash noted. Not diaphoretic. No erythema. No pallor.  Psychiatric: Mood, memory and judgment normal.  Vitals reviewed.  LABORATORY DATA: Lab Results  Component Value Date   WBC 7.8 04/14/2022   HGB 10.6 (L) 04/14/2022   HCT 35.5 (L) 04/14/2022   MCV 68.0 (L) 04/14/2022   PLT 338 04/14/2022      Chemistry      Component Value Date/Time   NA 139 04/14/2022 1222   K 3.8 04/14/2022 1222   CL 105 04/14/2022 1222   CO2 29 04/14/2022 1222   BUN 18 04/14/2022 1222   CREATININE 0.93 04/14/2022 1222      Component Value Date/Time   CALCIUM 9.0 04/14/2022 1222   ALKPHOS 147 (H) 04/14/2022 1222   AST 10 (L) 04/14/2022 1222   ALT 10 04/14/2022 1222   BILITOT 0.6 04/14/2022 1222       RADIOGRAPHIC STUDIES:  No results found.   ASSESSMENT/PLAN:  This is a very pleasant 52 year old Caucasian female referred to the clinic for iron deficiency anemia.   She has intolerance to iron supplements.   She receives IV iron PRN with  venofer 300 mg x3, the most recent dose on 05/12/22.   The patient had repeat labs performed. The patient had a CBC, iron studies, and ferritin. Her labs showed mild anemia with a hemoglobin of 11.8.  Her iron studies, ferritin, and B12 are pending at the time of her appointment.  After leaving the clinic, her iron studies resulted which showed low end of normal iron at 37, continued low saturation at 8, and elevated TIBC at 487.   I do not have her B12 and ferritin but it is likely she will require IV iron infusions.  I will arrange for additional IV iron with Venofer 300 mg weekly x 3 starting next week.  I will send the patient a MyChart message once I have the results of her B12 and ferritin with the final recommendation.   We will see her back for follow-up visit in 2 months for evaluation repeat blood work.   She will have her colonoscopy and EGD on 7/1 as planned by GI.  Of course, if this shows any concerning findings such as malignancy, we will arrange for follow-up sooner.  The patient is hopeful she can have a referral to Dr. Jae Dire or Dr. Seymour Bars for second opinion regarding her cervical mass and ovarian cyst.  She is expected to have shoulder surgery on 07/20/2022.  She will continue to increase her dietary intake of iron rich food.  Also encouraged the patient to take a multivitamin with B12 or take B12 supplement over-the-counter as her B12 was borderline low at her last appointment.  Her results from today are still pending.  The patient was advised to call immediately if she has any concerning symptoms in the interval. The patient voices understanding of current disease status and treatment options and is in agreement with the current care plan. All questions were answered. The patient knows to call the clinic with any problems, questions or concerns. We can certainly see the patient much sooner if necessary    No orders of the defined types were placed in this encounter.     The total time spent in the appointment was 20-29 minutes  Derius Ghosh L Parul Porcelli, PA-C 07/12/22

## 2022-07-14 ENCOUNTER — Inpatient Hospital Stay: Payer: Medicaid Other | Admitting: Physician Assistant

## 2022-07-14 ENCOUNTER — Inpatient Hospital Stay: Payer: Medicaid Other | Attending: Physician Assistant

## 2022-07-14 ENCOUNTER — Other Ambulatory Visit: Payer: Self-pay

## 2022-07-14 VITALS — BP 152/67 | HR 61 | Temp 97.7°F | Resp 13 | Wt 294.8 lb

## 2022-07-14 DIAGNOSIS — Z79899 Other long term (current) drug therapy: Secondary | ICD-10-CM | POA: Diagnosis not present

## 2022-07-14 DIAGNOSIS — E538 Deficiency of other specified B group vitamins: Secondary | ICD-10-CM

## 2022-07-14 DIAGNOSIS — E611 Iron deficiency: Secondary | ICD-10-CM

## 2022-07-14 DIAGNOSIS — N888 Other specified noninflammatory disorders of cervix uteri: Secondary | ICD-10-CM | POA: Diagnosis not present

## 2022-07-14 DIAGNOSIS — D509 Iron deficiency anemia, unspecified: Secondary | ICD-10-CM | POA: Insufficient documentation

## 2022-07-14 LAB — CBC WITH DIFFERENTIAL (CANCER CENTER ONLY)
Abs Immature Granulocytes: 0.01 10*3/uL (ref 0.00–0.07)
Basophils Absolute: 0.1 10*3/uL (ref 0.0–0.1)
Basophils Relative: 1 %
Eosinophils Absolute: 0.4 10*3/uL (ref 0.0–0.5)
Eosinophils Relative: 6 %
HCT: 37.5 % (ref 36.0–46.0)
Hemoglobin: 11.8 g/dL — ABNORMAL LOW (ref 12.0–15.0)
Immature Granulocytes: 0 %
Lymphocytes Relative: 40 %
Lymphs Abs: 2.2 10*3/uL (ref 0.7–4.0)
MCH: 25.1 pg — ABNORMAL LOW (ref 26.0–34.0)
MCHC: 31.5 g/dL (ref 30.0–36.0)
MCV: 79.8 fL — ABNORMAL LOW (ref 80.0–100.0)
Monocytes Absolute: 0.3 10*3/uL (ref 0.1–1.0)
Monocytes Relative: 5 %
Neutro Abs: 2.6 10*3/uL (ref 1.7–7.7)
Neutrophils Relative %: 48 %
Platelet Count: 206 10*3/uL (ref 150–400)
RBC: 4.7 MIL/uL (ref 3.87–5.11)
RDW: 20.2 % — ABNORMAL HIGH (ref 11.5–15.5)
WBC Count: 5.6 10*3/uL (ref 4.0–10.5)
nRBC: 0 % (ref 0.0–0.2)

## 2022-07-14 LAB — IRON AND IRON BINDING CAPACITY (CC-WL,HP ONLY)
Iron: 37 ug/dL (ref 28–170)
Saturation Ratios: 8 % — ABNORMAL LOW (ref 10.4–31.8)
TIBC: 487 ug/dL — ABNORMAL HIGH (ref 250–450)
UIBC: 450 ug/dL — ABNORMAL HIGH (ref 148–442)

## 2022-07-14 LAB — VITAMIN B12: Vitamin B-12: 244 pg/mL (ref 180–914)

## 2022-07-14 LAB — FERRITIN: Ferritin: 6 ng/mL — ABNORMAL LOW (ref 11–307)

## 2022-07-16 ENCOUNTER — Other Ambulatory Visit: Payer: Self-pay | Admitting: Physician Assistant

## 2022-07-16 ENCOUNTER — Encounter: Payer: Self-pay | Admitting: Physician Assistant

## 2022-07-16 ENCOUNTER — Telehealth: Payer: Self-pay | Admitting: Medical Oncology

## 2022-07-16 DIAGNOSIS — D509 Iron deficiency anemia, unspecified: Secondary | ICD-10-CM

## 2022-07-16 NOTE — Progress Notes (Signed)
See the message that Dr. Seymour Bars is leaving town for the [place] and Dr. Estanislado Pandy works at [facility].  Therefore I will enter a new GYN referral.  I will send a MyChart message to the patient informing her of this.

## 2022-07-16 NOTE — Telephone Encounter (Signed)
Faxed referral ,demographics and progress note to Dr Estanislado Pandy.

## 2022-07-26 ENCOUNTER — Encounter: Payer: No Typology Code available for payment source | Admitting: Gastroenterology

## 2022-07-26 ENCOUNTER — Telehealth: Payer: Self-pay | Admitting: Gastroenterology

## 2022-07-26 NOTE — Telephone Encounter (Signed)
Patient cancelled procedure due to having a fever and prep complications. Please advise.

## 2022-08-05 ENCOUNTER — Telehealth: Payer: Self-pay | Admitting: Medical Oncology

## 2022-08-05 NOTE — Telephone Encounter (Signed)
"  [facility] " did receive the referral and the practice administrator apologized for not letting us know they are not accepting Medicaid at this time.

## 2022-08-11 ENCOUNTER — Other Ambulatory Visit: Payer: Self-pay

## 2022-08-11 ENCOUNTER — Inpatient Hospital Stay: Payer: Medicaid Other | Attending: Physician Assistant

## 2022-08-11 VITALS — BP 160/82 | HR 60 | Temp 98.0°F | Resp 14

## 2022-08-11 DIAGNOSIS — E611 Iron deficiency: Secondary | ICD-10-CM

## 2022-08-11 DIAGNOSIS — D509 Iron deficiency anemia, unspecified: Secondary | ICD-10-CM | POA: Insufficient documentation

## 2022-08-11 MED ORDER — CETIRIZINE HCL 10 MG PO TABS
10.0000 mg | ORAL_TABLET | Freq: Once | ORAL | Status: AC
  Administered 2022-08-11: 10 mg via ORAL
  Filled 2022-08-11: qty 1

## 2022-08-11 MED ORDER — SODIUM CHLORIDE 0.9 % IV SOLN: Freq: Once | INTRAVENOUS | Status: AC

## 2022-08-11 MED ORDER — SODIUM CHLORIDE 0.9 % IV SOLN
300.0000 mg | Freq: Once | INTRAVENOUS | Status: AC
  Administered 2022-08-11: 300 mg via INTRAVENOUS
  Filled 2022-08-11: qty 300

## 2022-08-11 MED ORDER — ACETAMINOPHEN 325 MG PO TABS
650.0000 mg | ORAL_TABLET | Freq: Once | ORAL | Status: AC
  Administered 2022-08-11: 650 mg via ORAL
  Filled 2022-08-11: qty 2

## 2022-08-11 NOTE — Progress Notes (Signed)
Pt observed for 30 minutes post Venofer infusion. Pt tolerated trtmt well w/out incident. VSS at discharge.  Ambulatory to lobby.   

## 2022-08-11 NOTE — Patient Instructions (Signed)
Iron Sucrose Injection What is this medication? IRON SUCROSE (EYE ern SOO krose) treats low levels of iron (iron deficiency anemia) in people with kidney disease. Iron is a mineral that plays an important role in making red blood cells, which carry oxygen from your lungs to the rest of your body. This medicine may be used for other purposes; ask your health care provider or pharmacist if you have questions. COMMON BRAND NAME(S): Venofer What should I tell my care team before I take this medication? They need to know if you have any of these conditions: Anemia not caused by low iron levels Heart disease High levels of iron in the blood Kidney disease Liver disease An unusual or allergic reaction to iron, other medications, foods, dyes, or preservatives Pregnant or trying to get pregnant Breastfeeding How should I use this medication? This medication is for infusion into a vein. It is given in a hospital or clinic setting. Talk to your care team about the use of this medication in children. While this medication may be prescribed for children as young as 2 years for selected conditions, precautions do apply. Overdosage: If you think you have taken too much of this medicine contact a poison control center or emergency room at once. NOTE: This medicine is only for you. Do not share this medicine with others. What if I miss a dose? Keep appointments for follow-up doses. It is important not to miss your dose. Call your care team if you are unable to keep an appointment. What may interact with this medication? Do not take this medication with any of the following: Deferoxamine Dimercaprol Other iron products This medication may also interact with the following: Chloramphenicol Deferasirox This list may not describe all possible interactions. Give your health care provider a list of all the medicines, herbs, non-prescription drugs, or dietary supplements you use. Also tell them if you smoke,  drink alcohol, or use illegal drugs. Some items may interact with your medicine. What should I watch for while using this medication? Visit your care team regularly. Tell your care team if your symptoms do not start to get better or if they get worse. You may need blood work done while you are taking this medication. You may need to follow a special diet. Talk to your care team. Foods that contain iron include: whole grains/cereals, dried fruits, beans, or peas, leafy green vegetables, and organ meats (liver, kidney). What side effects may I notice from receiving this medication? Side effects that you should report to your care team as soon as possible: Allergic reactions--skin rash, itching, hives, swelling of the face, lips, tongue, or throat Low blood pressure--dizziness, feeling faint or lightheaded, blurry vision Shortness of breath Side effects that usually do not require medical attention (report to your care team if they continue or are bothersome): Flushing Headache Joint pain Muscle pain Nausea Pain, redness, or irritation at injection site This list may not describe all possible side effects. Call your doctor for medical advice about side effects. You may report side effects to FDA at 1-800-FDA-1088. Where should I keep my medication? This medication is given in a hospital or clinic and will not be stored at home. NOTE: This sheet is a summary. It may not cover all possible information. If you have questions about this medicine, talk to your doctor, pharmacist, or health care provider.  2024 Elsevier/Gold Standard (2021-07-22 00:00:00)  

## 2022-08-16 ENCOUNTER — Telehealth: Payer: Self-pay | Admitting: Medical Oncology

## 2022-08-16 NOTE — Telephone Encounter (Signed)
Pt notified that Dr Estanislado Pandy is not accepting new pts. She will think about another provider and call back with the name.

## 2022-08-18 ENCOUNTER — Inpatient Hospital Stay: Payer: Medicaid Other

## 2022-08-18 VITALS — BP 156/77 | HR 60 | Temp 97.9°F | Resp 16

## 2022-08-18 DIAGNOSIS — E611 Iron deficiency: Secondary | ICD-10-CM

## 2022-08-18 DIAGNOSIS — D509 Iron deficiency anemia, unspecified: Secondary | ICD-10-CM | POA: Diagnosis not present

## 2022-08-18 MED ORDER — ACETAMINOPHEN 325 MG PO TABS
650.0000 mg | ORAL_TABLET | Freq: Once | ORAL | Status: AC
  Administered 2022-08-18: 650 mg via ORAL
  Filled 2022-08-18: qty 2

## 2022-08-18 MED ORDER — CETIRIZINE HCL 10 MG PO TABS
10.0000 mg | ORAL_TABLET | Freq: Once | ORAL | Status: AC
  Administered 2022-08-18: 10 mg via ORAL
  Filled 2022-08-18: qty 1

## 2022-08-18 MED ORDER — SODIUM CHLORIDE 0.9 % IV SOLN
300.0000 mg | Freq: Once | INTRAVENOUS | Status: AC
  Administered 2022-08-18: 300 mg via INTRAVENOUS
  Filled 2022-08-18: qty 300

## 2022-08-18 MED ORDER — SODIUM CHLORIDE 0.9 % IV SOLN: Freq: Once | INTRAVENOUS | Status: AC

## 2022-08-18 NOTE — Patient Instructions (Signed)
Iron Sucrose Injection What is this medication? IRON SUCROSE (EYE ern SOO krose) treats low levels of iron (iron deficiency anemia) in people with kidney disease. Iron is a mineral that plays an important role in making red blood cells, which carry oxygen from your lungs to the rest of your body. This medicine may be used for other purposes; ask your health care provider or pharmacist if you have questions. COMMON BRAND NAME(S): Venofer What should I tell my care team before I take this medication? They need to know if you have any of these conditions: Anemia not caused by low iron levels Heart disease High levels of iron in the blood Kidney disease Liver disease An unusual or allergic reaction to iron, other medications, foods, dyes, or preservatives Pregnant or trying to get pregnant Breastfeeding How should I use this medication? This medication is for infusion into a vein. It is given in a hospital or clinic setting. Talk to your care team about the use of this medication in children. While this medication may be prescribed for children as young as 2 years for selected conditions, precautions do apply. Overdosage: If you think you have taken too much of this medicine contact a poison control center or emergency room at once. NOTE: This medicine is only for you. Do not share this medicine with others. What if I miss a dose? Keep appointments for follow-up doses. It is important not to miss your dose. Call your care team if you are unable to keep an appointment. What may interact with this medication? Do not take this medication with any of the following: Deferoxamine Dimercaprol Other iron products This medication may also interact with the following: Chloramphenicol Deferasirox This list may not describe all possible interactions. Give your health care provider a list of all the medicines, herbs, non-prescription drugs, or dietary supplements you use. Also tell them if you smoke,  drink alcohol, or use illegal drugs. Some items may interact with your medicine. What should I watch for while using this medication? Visit your care team regularly. Tell your care team if your symptoms do not start to get better or if they get worse. You may need blood work done while you are taking this medication. You may need to follow a special diet. Talk to your care team. Foods that contain iron include: whole grains/cereals, dried fruits, beans, or peas, leafy green vegetables, and organ meats (liver, kidney). What side effects may I notice from receiving this medication? Side effects that you should report to your care team as soon as possible: Allergic reactions--skin rash, itching, hives, swelling of the face, lips, tongue, or throat Low blood pressure--dizziness, feeling faint or lightheaded, blurry vision Shortness of breath Side effects that usually do not require medical attention (report to your care team if they continue or are bothersome): Flushing Headache Joint pain Muscle pain Nausea Pain, redness, or irritation at injection site This list may not describe all possible side effects. Call your doctor for medical advice about side effects. You may report side effects to FDA at 1-800-FDA-1088. Where should I keep my medication? This medication is given in a hospital or clinic and will not be stored at home. NOTE: This sheet is a summary. It may not cover all possible information. If you have questions about this medicine, talk to your doctor, pharmacist, or health care provider.  2024 Elsevier/Gold Standard (2021-07-22 00:00:00)  

## 2022-08-24 ENCOUNTER — Encounter: Payer: Self-pay | Admitting: Physician Assistant

## 2022-08-25 ENCOUNTER — Inpatient Hospital Stay: Payer: Medicaid Other

## 2022-08-25 ENCOUNTER — Other Ambulatory Visit: Payer: Self-pay

## 2022-08-25 VITALS — BP 130/72 | HR 59 | Temp 97.8°F | Resp 18

## 2022-08-25 DIAGNOSIS — E611 Iron deficiency: Secondary | ICD-10-CM

## 2022-08-25 DIAGNOSIS — D509 Iron deficiency anemia, unspecified: Secondary | ICD-10-CM | POA: Diagnosis not present

## 2022-08-25 MED ORDER — SODIUM CHLORIDE 0.9 % IV SOLN
300.0000 mg | Freq: Once | INTRAVENOUS | Status: AC
  Administered 2022-08-25: 300 mg via INTRAVENOUS
  Filled 2022-08-25: qty 300

## 2022-08-25 MED ORDER — ACETAMINOPHEN 325 MG PO TABS
650.0000 mg | ORAL_TABLET | Freq: Once | ORAL | Status: AC
  Administered 2022-08-25: 650 mg via ORAL
  Filled 2022-08-25: qty 2

## 2022-08-25 MED ORDER — SODIUM CHLORIDE 0.9 % IV SOLN: Freq: Once | INTRAVENOUS | Status: AC

## 2022-08-25 MED ORDER — CETIRIZINE HCL 10 MG PO TABS
10.0000 mg | ORAL_TABLET | Freq: Once | ORAL | Status: AC
  Administered 2022-08-25: 10 mg via ORAL
  Filled 2022-08-25: qty 1

## 2022-08-25 NOTE — Patient Instructions (Signed)
Iron Sucrose Injection What is this medication? IRON SUCROSE (EYE ern SOO krose) treats low levels of iron (iron deficiency anemia) in people with kidney disease. Iron is a mineral that plays an important role in making red blood cells, which carry oxygen from your lungs to the rest of your body. This medicine may be used for other purposes; ask your health care provider or pharmacist if you have questions. COMMON BRAND NAME(S): Venofer What should I tell my care team before I take this medication? They need to know if you have any of these conditions: Anemia not caused by low iron levels Heart disease High levels of iron in the blood Kidney disease Liver disease An unusual or allergic reaction to iron, other medications, foods, dyes, or preservatives Pregnant or trying to get pregnant Breastfeeding How should I use this medication? This medication is for infusion into a vein. It is given in a hospital or clinic setting. Talk to your care team about the use of this medication in children. While this medication may be prescribed for children as young as 2 years for selected conditions, precautions do apply. Overdosage: If you think you have taken too much of this medicine contact a poison control center or emergency room at once. NOTE: This medicine is only for you. Do not share this medicine with others. What if I miss a dose? Keep appointments for follow-up doses. It is important not to miss your dose. Call your care team if you are unable to keep an appointment. What may interact with this medication? Do not take this medication with any of the following: Deferoxamine Dimercaprol Other iron products This medication may also interact with the following: Chloramphenicol Deferasirox This list may not describe all possible interactions. Give your health care provider a list of all the medicines, herbs, non-prescription drugs, or dietary supplements you use. Also tell them if you smoke,  drink alcohol, or use illegal drugs. Some items may interact with your medicine. What should I watch for while using this medication? Visit your care team regularly. Tell your care team if your symptoms do not start to get better or if they get worse. You may need blood work done while you are taking this medication. You may need to follow a special diet. Talk to your care team. Foods that contain iron include: whole grains/cereals, dried fruits, beans, or peas, leafy green vegetables, and organ meats (liver, kidney). What side effects may I notice from receiving this medication? Side effects that you should report to your care team as soon as possible: Allergic reactions--skin rash, itching, hives, swelling of the face, lips, tongue, or throat Low blood pressure--dizziness, feeling faint or lightheaded, blurry vision Shortness of breath Side effects that usually do not require medical attention (report to your care team if they continue or are bothersome): Flushing Headache Joint pain Muscle pain Nausea Pain, redness, or irritation at injection site This list may not describe all possible side effects. Call your doctor for medical advice about side effects. You may report side effects to FDA at 1-800-FDA-1088. Where should I keep my medication? This medication is given in a hospital or clinic. It will not be stored at home. NOTE: This sheet is a summary. It may not cover all possible information. If you have questions about this medicine, talk to your doctor, pharmacist, or health care provider.  2024 Elsevier/Gold Standard (2022-06-18 00:00:00)

## 2022-08-25 NOTE — Progress Notes (Signed)
Pt declines staying for her 30 min post-iron observation period. She is alert and oriented w/o complaint.

## 2022-09-06 IMAGING — US US PELVIS COMPLETE WITH TRANSVAGINAL
2 series · 13 of 25 positions shown · non-contrast
Comparison: None

CLINICAL DATA: Abnormal uterine bleeding

EXAM:
TRANSABDOMINAL AND TRANSVAGINAL ULTRASOUND OF PELVIS
TECHNIQUE: Both transabdominal and transvaginal ultrasound examinations of the
pelvis were performed. Transabdominal technique was performed for
global imaging of the pelvis including uterus, ovaries, adnexal
regions, and pelvic cul-de-sac. It was necessary to proceed with
endovaginal exam following the transabdominal exam to visualize the
uterus endometrium ovaries.

[Series 1: us pelvis complete with transvaginal · 0.25mm/px · 6 of 45 slices shown (1 of 2)]
[im 1/45]
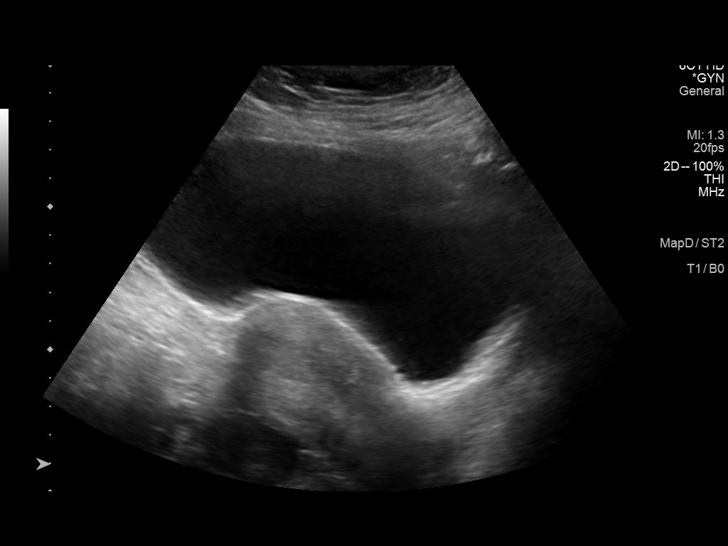
[im 9/45]
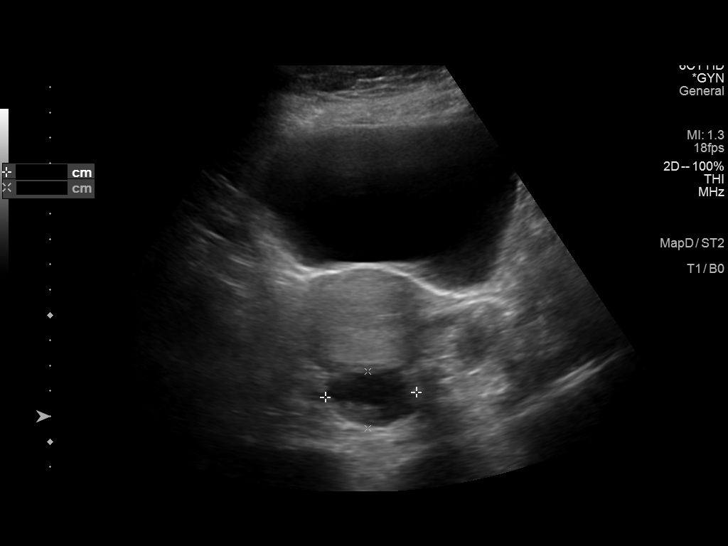
[im 17/45]
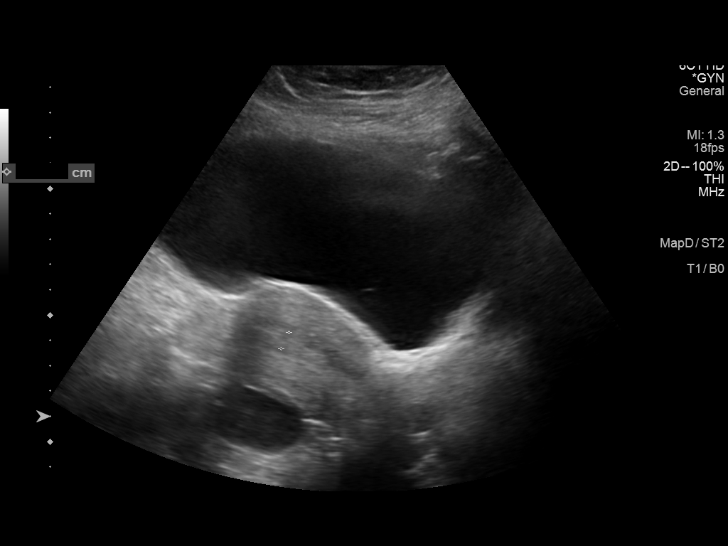
[im 25/45]
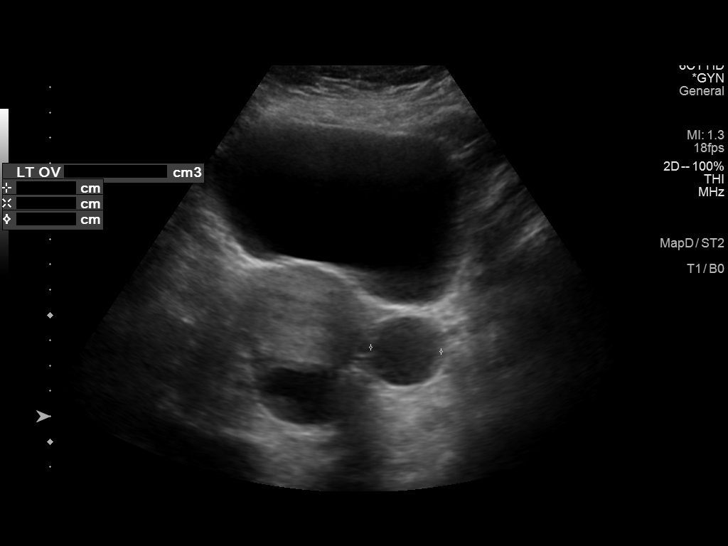
[im 33/45]
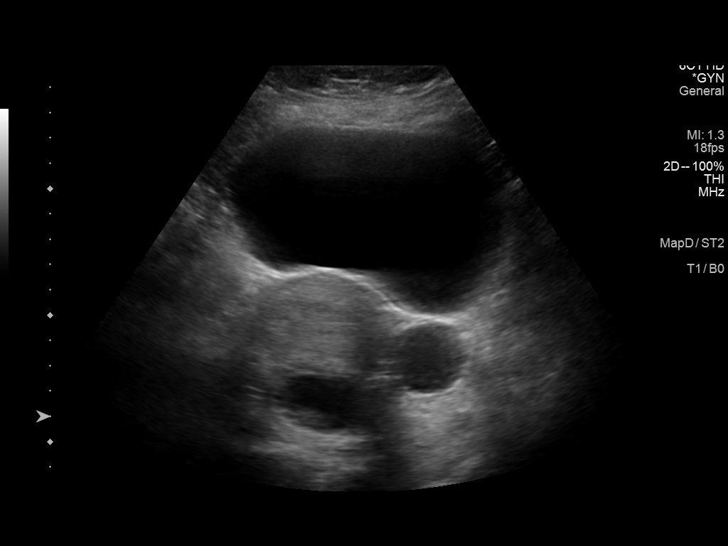
[im 41/45]
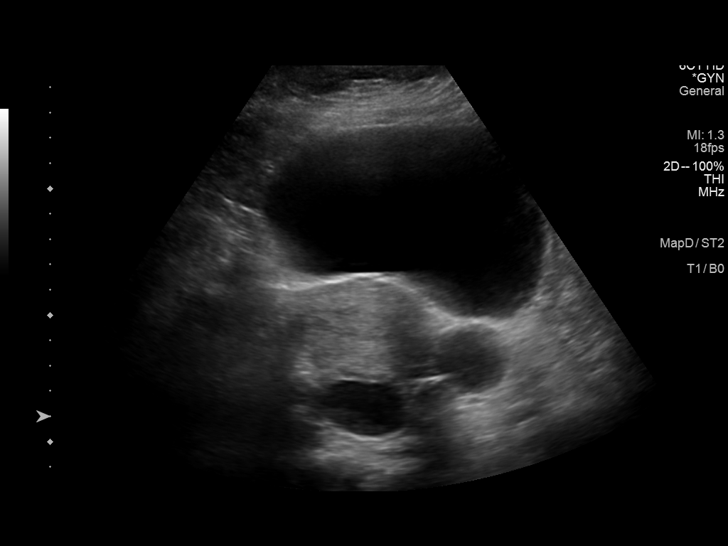

[Series 1001: us pelvis complete with transvaginal · 0.17mm/px · 7 of 50 slices shown (2 of 2)]
[im 1/50]
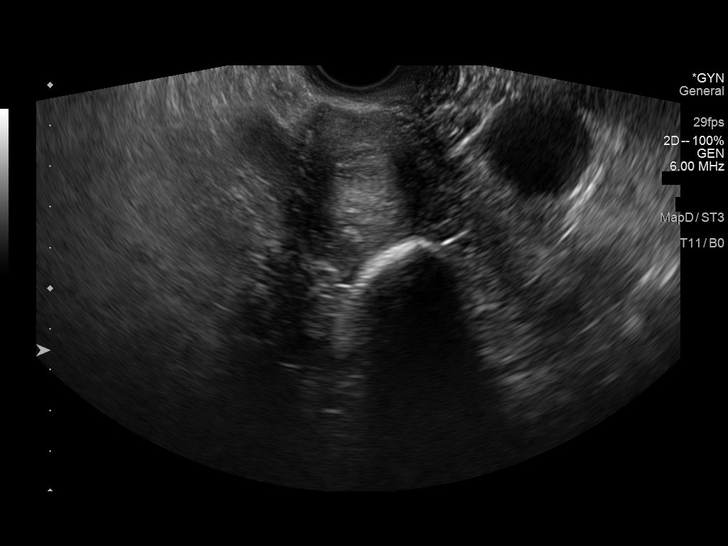
[im 9/50]
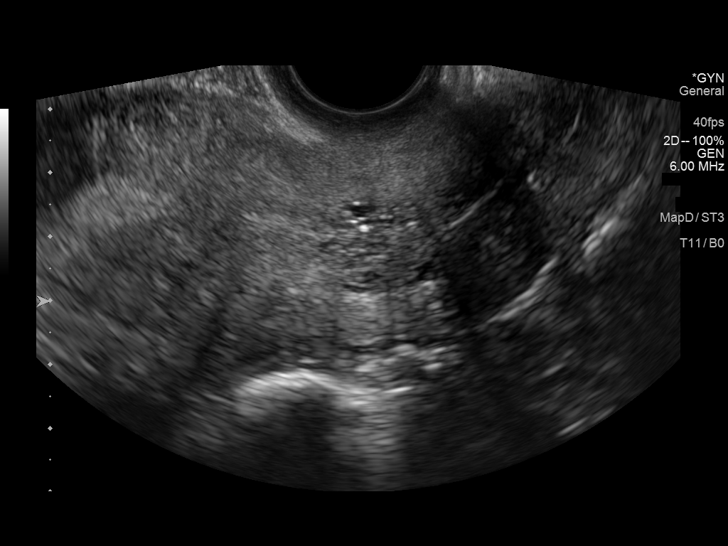
[im 17/50]
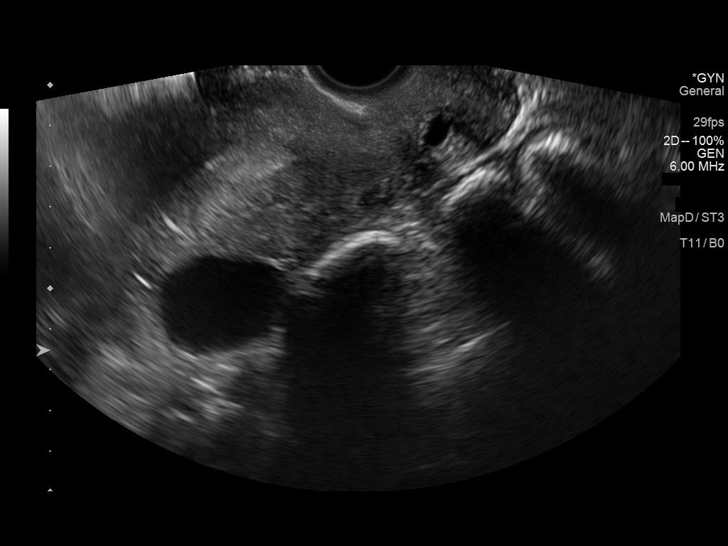
[im 25/50]
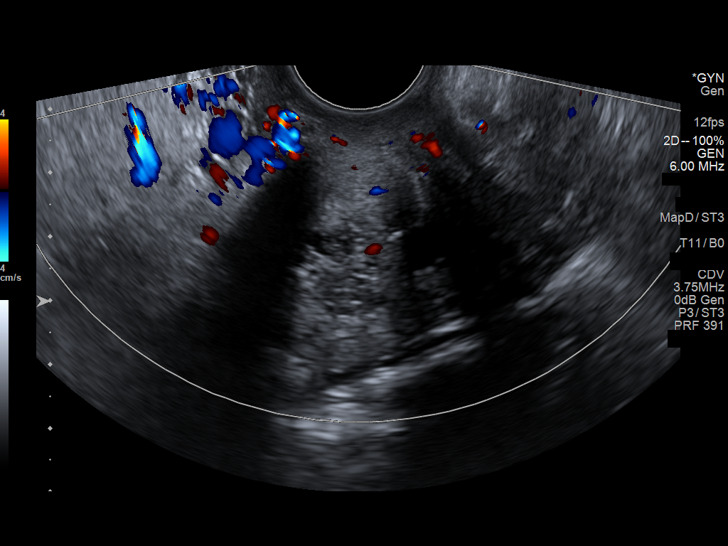
[im 33/50]
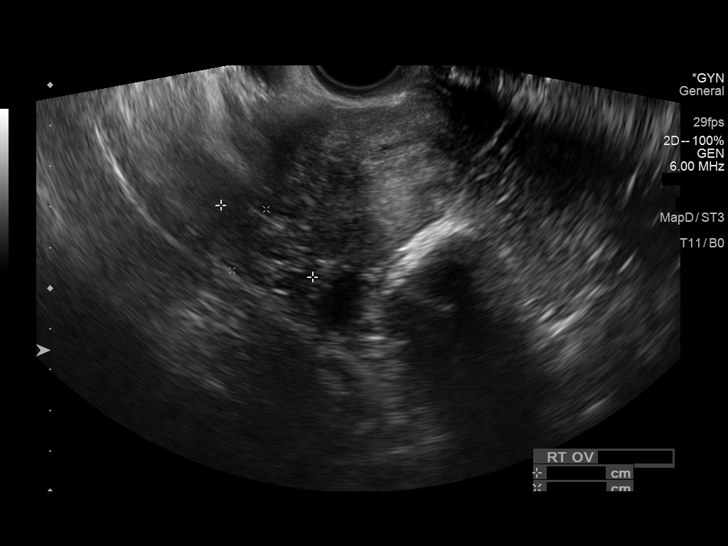
[im 41/50]
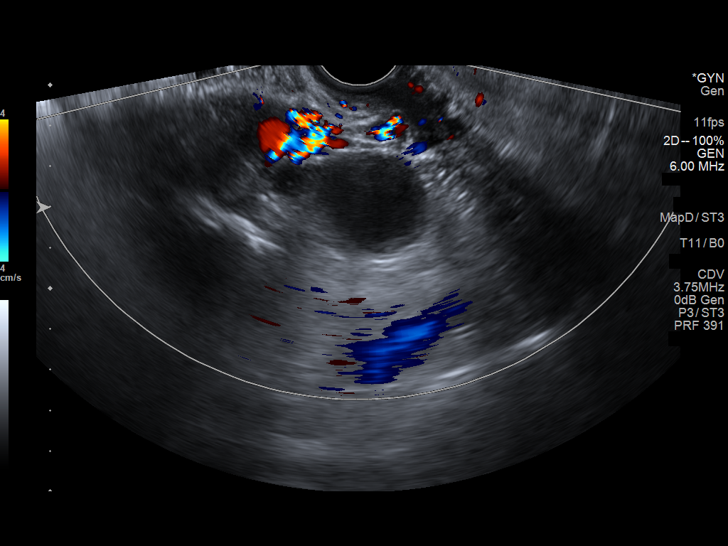
[im 50/50]
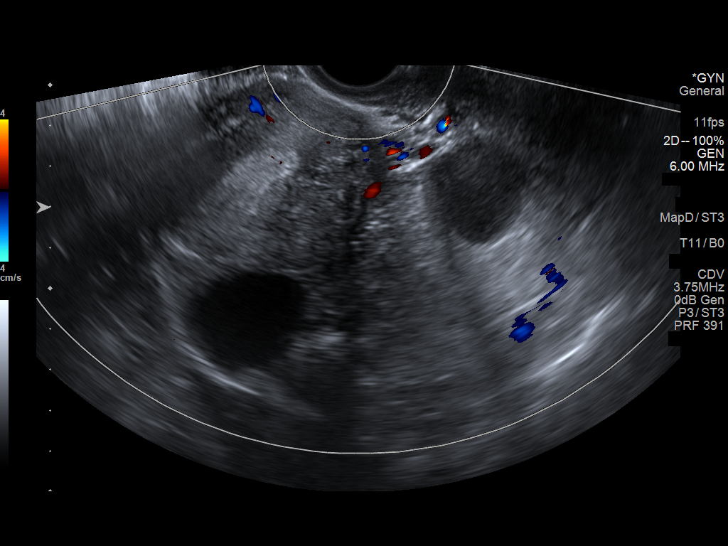

[13 of 25 positions shown; findings below may reference images not displayed]

FINDINGS: Uterus

Measurements: 9.5 x 4 x 5.3 cm = volume: 107 mL. Heterogenous mostly
solid mass at the lower uterine segment/cervix with microcystic
changes. This measures 1.7 x 1.6 x 1.9 cm.

Endometrium

Thickness: 9 mm.  No focal abnormality visualized.

Right ovary

Measurements: 2.9 x 1.7 x 3.3 cm = volume: 8.6 mL. Anechoic cyst
measuring 4.2 x 2.4 x 3.6 cm in the posterior cul-de-sac, possibly
exophytic to the right ovary which appears somewhat midline.

Left ovary

Measurements: 4.6 x 2.9 x 3.1 cm = volume: 21 mL. Cyst containing
low level echoes measuring 3.1 x 2.7 x 3 cm.

Other findings

No abnormal free fluid.
IMPRESSION: 1. Heterogenous 1.9 cm mass at the lower uterine segment/cervix,
suggest correlation with direct inspection and potential Pap smear.
2. 4.2 cm posterior cul-de-sac cyst, possibly exophytic to the right
ovary which is imaged somewhat at the midline. No follow up imaging
recommended. Note: This recommendation does not apply to
premenarchal patients or to those with increased risk (genetic,
family history, elevated tumor markers or other high-risk factors)
of ovarian cancer. Reference: Radiology [DATE]):[id number].
3. 3.1 cm indeterminate but probably benign cyst in the left ovary
suggestive of hemorrhagic cyst or possible endometrioma. Ultrasound
follow-up in 6-12 weeks is recommended for this finding.

## 2022-09-07 ENCOUNTER — Encounter: Payer: Self-pay | Admitting: Gastroenterology

## 2022-09-07 ENCOUNTER — Ambulatory Visit: Payer: Medicaid Other | Admitting: Gastroenterology

## 2022-09-07 VITALS — BP 188/85 | HR 61 | Temp 97.5°F | Resp 15 | Ht 69.0 in | Wt 291.0 lb

## 2022-09-07 DIAGNOSIS — K2900 Acute gastritis without bleeding: Secondary | ICD-10-CM

## 2022-09-07 DIAGNOSIS — D509 Iron deficiency anemia, unspecified: Secondary | ICD-10-CM

## 2022-09-07 DIAGNOSIS — Z538 Procedure and treatment not carried out for other reasons: Secondary | ICD-10-CM | POA: Diagnosis not present

## 2022-09-07 DIAGNOSIS — Z1211 Encounter for screening for malignant neoplasm of colon: Secondary | ICD-10-CM

## 2022-09-07 MED ORDER — PROMETHAZINE HCL 25 MG PO TABS: 12.5000 mg | ORAL_TABLET | Freq: Three times a day (TID) | ORAL | 0 refills | Status: AC | PRN

## 2022-09-07 MED ORDER — SODIUM CHLORIDE 0.9 % IV SOLN: 500.0000 mL | INTRAVENOUS | Status: DC

## 2022-09-07 MED ORDER — PLENVU 140 G PO SOLR: 1.0000 "application " | ORAL | 0 refills | Status: DC

## 2022-09-07 MED ORDER — PANTOPRAZOLE SODIUM 40 MG PO TBEC
40.0000 mg | DELAYED_RELEASE_TABLET | Freq: Every day | ORAL | 3 refills | Status: DC
Start: 2022-09-07 — End: 2023-10-24

## 2022-09-07 NOTE — Progress Notes (Signed)
GASTROENTEROLOGY PROCEDURE H&P NOTE   Primary Care Physician: [name], PA-C    Reason for Procedure:   Iron deficiency anemia  Plan:    EGD, colonoscopy  Patient is appropriate for endoscopic procedure(s) in the ambulatory ([facility]) setting.  The nature of the procedure, as well as the risks, benefits, and alternatives were carefully and thoroughly reviewed with the patient. Ample time for discussion and questions allowed. The patient understood, was satisfied, and agreed to proceed.     HPI: [name] is a 52 y.o. female who presents for EGD and colonoscopy for evaluation of iron deficiency anemia along with routine CRC screening.  Past Medical History:  Diagnosis Date   Allergy    Anxiety    reactive    Depression    Fibromyalgia    GERD (gastroesophageal reflux disease)    History of kidney stones    History of nephrolithiasis 11/2008   under urology care uric acid rx ?   Hypothyroid    PONV (postoperative nausea and vomiting)    Varicose veins    Ventral hernia     Past Surgical History:  Procedure Laterality Date   CARPAL TUNNEL RELEASE     CHOLECYSTECTOMY     FOOT SURGERY     INSERTION OF MESH N/A 09/24/2016   Procedure: INSERTION OF MESH;  Surgeon: Griselda Miner, MD;  Location: [facility] OR;  Service: General;  Laterality: N/A;   LITHOTRIPSY     twice by Dr. Patsi Sears   rt knee surgery  08/1987   teeth extracted     on top for decay   VENTRAL HERNIA REPAIR  09/24/2016   VENTRAL HERNIA REPAIR N/A 09/24/2016   Procedure: VENTRAL HERNIA REPAIR WITH MESH;  Surgeon: Griselda Miner, MD;  Location: [facility] OR;  Service: General;  Laterality: N/A;    Prior to Admission medications   Medication Sig Start Date End Date Taking? Authorizing Provider  busPIRone (BUSPAR) 10 MG tablet  06/27/21  Yes [provider]  cetirizine (ZYRTEC) 10 MG tablet Take 10 mg by mouth at bedtime.    Yes [provider]  DULoxetine (CYMBALTA) 60 MG capsule TAKE ONE  CAPSULE BY MOUTH ONCE DAILY. Patient taking differently: Take 60 mg by mouth 2 (two) times daily. 06/18/15  Yes Panosh, Neta Mends, MD  gabapentin (NEURONTIN) 600 MG tablet Take 600 mg by mouth 3 (three) times daily.   Yes [provider]  levothyroxine (SYNTHROID) 200 MCG tablet Take 200 mcg by mouth daily. 07/06/22  Yes [provider]  promethazine (PHENERGAN) 25 MG tablet Take 25 mg by mouth every 8 (eight) hours as needed. 07/20/22  Yes [provider]  cimetidine (TAGAMET) 200 MG tablet Take 200 mg by mouth daily as needed.    [provider]  furosemide (LASIX) 40 MG tablet Take 40 mg by mouth daily.    [provider]  levothyroxine (SYNTHROID, LEVOTHROID) 137 MCG tablet Take 137 mcg by mouth daily before breakfast.    [provider]  SUMAtriptan (IMITREX) 100 MG tablet Take 100 mg by mouth every 2 (two) hours as needed for migraine. May repeat in 2 hours if headache persists or recurs.    [provider]    Current Outpatient Medications  Medication Sig Dispense Refill   busPIRone (BUSPAR) 10 MG tablet      cetirizine (ZYRTEC) 10 MG tablet Take 10 mg by mouth at bedtime.      DULoxetine (CYMBALTA) 60 MG capsule TAKE  ONE CAPSULE BY MOUTH ONCE DAILY. (Patient taking differently: Take 60 mg by mouth 2 (two) times daily.) 30 capsule 0   gabapentin (NEURONTIN) 600 MG tablet Take 600 mg by mouth 3 (three) times daily.     levothyroxine (SYNTHROID) 200 MCG tablet Take 200 mcg by mouth daily.     promethazine (PHENERGAN) 25 MG tablet Take 25 mg by mouth every 8 (eight) hours as needed.     cimetidine (TAGAMET) 200 MG tablet Take 200 mg by mouth daily as needed.     furosemide (LASIX) 40 MG tablet Take 40 mg by mouth daily.     levothyroxine (SYNTHROID, LEVOTHROID) 137 MCG tablet Take 137 mcg by mouth daily before breakfast.     SUMAtriptan (IMITREX) 100 MG tablet Take 100 mg by mouth every 2 (two) hours as needed for migraine. May  repeat in 2 hours if headache persists or recurs.     Current Facility-Administered Medications  Medication Dose Route Frequency Provider Last Rate Last Admin   0.9 %  sodium chloride infusion  500 mL Intravenous Continuous Jeorge Reister V, DO        Allergies as of 09/07/2022 - Review Complete 09/07/2022  Allergen Reaction Noted   Bee venom Itching and Swelling 08/11/2022   Nalbuphine Other (See Comments)    Ondansetron Other (See Comments)     Family History  Problem Relation Age of Onset   Breast cancer Mother        deceased 16-Sep-2007   Osteoarthritis Mother    Heart disease Father    Pneumonia Father        Deceased, 23   Healthy Sister    ADD / ADHD Son    Colon cancer Neg Hx    Rectal cancer Neg Hx    Stomach cancer Neg Hx    Esophageal cancer Neg Hx     Social History   Socioeconomic History   Marital status: Divorced    Spouse name: Not on file   Number of children: 1   Years of education: Not on file   Highest education level: Not on file  Occupational History   Occupation: unempolyed  Tobacco Use   Smoking status: Never   Smokeless tobacco: Never  Vaping Use   Vaping status: Never Used  Substance and Sexual Activity   Alcohol use: No    Alcohol/week: 0.0 standard drinks of alcohol   Drug use: No   Sexual activity: Not on file  Other Topics Concern   Not on file  Social History Narrative   Neg etoh tea.    Independent   Condo doing better now   H H   2 son    She stopped working in 09-15-13 and previously working at a daycare and doing Restaurant manager, fast food. She is not on disability or unemployment.   Childbirth x 1   Mom and child in counseling doing well with support  Counseling     Father with  Hx os anger and other dx .   Divorced in June 2014.   Has BF supportive  and lives with son .                    Social Determinants of Health   Financial Resource Strain: Not on file  Food Insecurity: No Food Insecurity (04/14/2022)   Hunger Vital  Sign    Worried About Running Out of Food in the Last Year: Never true    Ran Out of Food in  the Last Year: Never true  Transportation Needs: No Transportation Needs (04/14/2022)   PRAPARE - Administrator, Civil Service (Medical): No    Lack of Transportation (Non-Medical): No  Physical Activity: Not on file  Stress: Not on file  Social Connections: Not on file  Intimate Partner Violence: Not on file    Physical Exam: Vital signs in last 24 hours: @BP  (!) 194/78   Pulse 60   Temp (!) 97.5 F (36.4 C)   Ht 5\' 9"  (1.753 m)   Wt 291 lb (132 kg)   SpO2 100%   BMI 42.97 kg/m  GEN: NAD EYE: Sclerae anicteric ENT: MMM CV: Non-tachycardic Pulm: CTA b/l GI: Soft, NT/ND NEURO:  Alert & Oriented x 3   Doristine Locks, DO [facility]   09/07/2022 1:24 PM

## 2022-09-07 NOTE — Op Note (Signed)
[facility] Patient Name: [name] Procedure Date: 09/07/2022 1:26 PM MRN: [id number] Endoscopist: Doristine Locks , MD, [id number] Age: 52 Referring MD:  Date of Birth: [birth year] Gender: Female Account #: [id number] Procedure:                Upper GI endoscopy Indications:              Iron deficiency anemia Medicines:                Monitored Anesthesia Care Procedure:                Pre-Anesthesia Assessment:                           - Prior to the procedure, a History and Physical                            was performed, and patient medications and                            allergies were reviewed. The patient's tolerance of                            previous anesthesia was also reviewed. The risks                            and benefits of the procedure and the sedation                            options and risks were discussed with the patient.                            All questions were answered, and informed consent                            was obtained. Prior Anticoagulants: The patient has                            taken no anticoagulant or antiplatelet agents. ASA                            Grade Assessment: III - A patient with severe                            systemic disease. After reviewing the risks and                            benefits, the patient was deemed in satisfactory                            condition to undergo the procedure.                           After obtaining informed consent, the endoscope was  passed under direct vision. Throughout the                            procedure, the patient's blood pressure, pulse, and                            oxygen saturations were monitored continuously. The                            Olympus scope [id number] was introduced through the                            mouth, and advanced to the third part of duodenum.                            The upper GI endoscopy  was accomplished without                            difficulty. The patient tolerated the procedure                            well. Scope In: Scope Out: Findings:                 The examined esophagus was normal.                           Moderate inflammation characterized by congestion                            (edema) and erythema was found in the gastric                            fundus, in the gastric body and in the gastric                            antrum. Biopsies were taken with a cold forceps for                            Helicobacter pylori testing. Estimated blood loss                            was minimal.                           The examined duodenum was normal. Biopsies for                            histology were taken with a cold forceps for                            evaluation of celiac disease. Estimated blood loss                            was minimal. Complications:  No immediate complications. Estimated Blood Loss:     Estimated blood loss was minimal. Impression:               - Normal esophagus.                           - Moderate, non-ulcer gastritis. Biopsied.                           - Normal examined duodenum. Biopsied. Recommendation:           - Patient has a contact number available for                            emergencies. The signs and symptoms of potential                            delayed complications were discussed with the                            patient. Return to normal activities tomorrow.                            Written discharge instructions were provided to the                            patient.                           - Advance diet as tolerated.                           - Continue present medications.                           - Await pathology results.                           - Use Protonix (pantoprazole) 40 mg PO BID for 4                            weeks to promote mucosal healing of gastritis, then                             reduce to 40 mg daily, and if no GI symptoms, can                            titrate off completely.                           - Perform a colonoscopy today. Doristine Locks, MD 09/07/2022 2:07:18 PM

## 2022-09-07 NOTE — Op Note (Signed)
[facility] Patient Name: [name] Procedure Date: 09/07/2022 1:25 PM MRN: [id number] Endoscopist: Doristine Locks , MD, [id number] Age: 52 Referring MD:  Date of Birth: 06-15-1970 Gender: Female Account #: [id number] Procedure:                Colonoscopy Indications:              Iron deficiency anemia Medicines:                Monitored Anesthesia Care Procedure:                Pre-Anesthesia Assessment:                           - Prior to the procedure, a History and Physical                            was performed, and patient medications and                            allergies were reviewed. The patient's tolerance of                            previous anesthesia was also reviewed. The risks                            and benefits of the procedure and the sedation                            options and risks were discussed with the patient.                            All questions were answered, and informed consent                            was obtained. Prior Anticoagulants: The patient has                            taken no anticoagulant or antiplatelet agents. ASA                            Grade Assessment: III - A patient with severe                            systemic disease. After reviewing the risks and                            benefits, the patient was deemed in satisfactory                            condition to undergo the procedure.                           After obtaining informed consent, the colonoscope  was passed under direct vision. Throughout the                            procedure, the patient's blood pressure, pulse, and                            oxygen saturations were monitored continuously. The                            CF [id number] #[id number] was introduced through the anus                            with the intention of advancing to the cecum. The                            scope was advanced to the  ascending colon before                            the procedure was aborted. Medications were given.                            The colonoscopy was technically difficult and                            complex due to poor bowel prep. The patient                            tolerated the procedure well. The quality of the                            bowel preparation was poor. The rectum was                            photographed. Scope In: 1:51:20 PM Scope Out: 1:59:26 PM Scope Withdrawal Time: 0 hours 2 minutes 46 seconds  Total Procedure Duration: 0 hours 8 minutes 6 seconds  Findings:                 The perianal and digital rectal examinations were                            normal.                           A large amount of stool was found in the entire                            examined colon, precluding visualization. Lavage of                            the area was performed using copious amounts of tap                            water, resulting in incomplete clearance with fair  visualization. The significant stool burden                            precluded any further safe advancement of the                            colonoscope, and the procedure was then terminated.                           The retroflexed view of the distal rectum and anal                            verge was normal and showed no anal or rectal                            abnormalities. Complications:            No immediate complications. Estimated Blood Loss:     Estimated blood loss: none. Impression:               - Preparation of the colon was poor.                           - Stool in the entire examined colon.                           - The distal rectum and anal verge are normal on                            retroflexion view.                           - No specimens collected. Recommendation:           - Patient has a contact number available for                             emergencies. The signs and symptoms of potential                            delayed complications were discussed with the                            patient. Return to normal activities tomorrow.                            Written discharge instructions were provided to the                            patient.                           - Resume previous diet.                           - Repeat colonoscopy at the next available  appointment because the bowel preparation was poor.                           - Plan for extended 2-day bowel preparation for                            next colonoscopy, to include scheduled anti-emetics                            with the bowel prep. Doristine Locks, MD 09/07/2022 2:13:19 PM

## 2022-09-07 NOTE — Progress Notes (Signed)
Vss nad trans to pacu 

## 2022-09-07 NOTE — Patient Instructions (Addendum)
Take your protonix a 1/2 hour before a meal.  Do not take it on a full stomach.  Resume all of your other medications today as ordered.  Be sure to read your instructions for your next colonoscopy.  The doctor gave you phenergan for the nausea.  You may take 1/2 tablet every 8 hours while you are taking your prep in September. Your recovery room nurse went over all of the instructions with you.  YOU HAD AN ENDOSCOPIC PROCEDURE TODAY AT THE [facility]:   Refer to the procedure report that was given to you for any specific questions about what was found during the examination.  If the procedure report does not answer your questions, please call your gastroenterologist to clarify.  If you requested that your care partner not be given the details of your procedure findings, then the procedure report has been included in a sealed envelope for you to review at your convenience later.  YOU SHOULD EXPECT: Some feelings of bloating in the abdomen. Passage of more gas than usual.  Walking can help get rid of the air that was put into your GI tract during the procedure and reduce the bloating. If you had a lower endoscopy (such as a colonoscopy or flexible sigmoidoscopy) you may notice spotting of blood in your stool or on the toilet paper. If you underwent a bowel prep for your procedure, you may not have a normal bowel movement for a few days.  Please Note:  You might notice some irritation and congestion in your nose or some drainage.  This is from the oxygen used during your procedure.  There is no need for concern and it should clear up in a day or so.  SYMPTOMS TO REPORT IMMEDIATELY:  Following lower endoscopy (colonoscopy or flexible sigmoidoscopy):  Excessive amounts of blood in the stool  Significant tenderness or worsening of abdominal pains  Swelling of the abdomen that is new, acute  Fever of 100F or higher  Following upper endoscopy (EGD)  Vomiting of blood or coffee ground  material  New chest pain or pain under the shoulder blades  Painful or persistently difficult swallowing  New shortness of breath  Fever of 100F or higher  Black, tarry-looking stools  For urgent or emergent issues, a gastroenterologist can be reached at any hour by calling [phone]. Do not use MyChart messaging for urgent concerns.    DIET:  We do recommend a small meal at first, but then you may proceed to your regular diet.  Drink plenty of fluids but you should avoid alcoholic beverages for 24 hours.  ACTIVITY:  You should plan to take it easy for the rest of today and you should NOT DRIVE or use heavy machinery until tomorrow (because of the sedation medicines used during the test).    FOLLOW UP: Our staff will call the number listed on your records the next business day following your procedure.  We will call around 7:15- 8:00 am to check on you and address any questions or concerns that you may have regarding the information given to you following your procedure. If we do not reach you, we will leave a message.     If any biopsies were taken you will be contacted by phone or by letter within the next 1-3 weeks.  Please call us at [phone] if you have not heard about the biopsies in 3 weeks.    SIGNATURES/CONFIDENTIALITY: You and/or your care partner have signed paperwork which  will be entered into your electronic medical record.  These signatures attest to the fact that that the information above on your After Visit Summary has been reviewed and is understood.  Full responsibility of the confidentiality of this discharge information lies with you and/or your care-partner.

## 2022-09-07 NOTE — Progress Notes (Signed)
Called to room to assist during endoscopic procedure.  Patient ID and intended procedure confirmed with present staff. Received instructions for my participation in the procedure from the performing physician.  

## 2022-09-07 NOTE — Progress Notes (Signed)
Pt's states no medical or surgical changes since previsit or office visit. 

## 2022-09-08 ENCOUNTER — Telehealth: Payer: Self-pay

## 2022-09-08 NOTE — Telephone Encounter (Signed)
No answer on follow up call. 

## 2022-09-15 ENCOUNTER — Inpatient Hospital Stay: Payer: Medicaid Other | Attending: Physician Assistant

## 2022-09-15 ENCOUNTER — Inpatient Hospital Stay (HOSPITAL_BASED_OUTPATIENT_CLINIC_OR_DEPARTMENT_OTHER): Payer: Medicaid Other | Admitting: Internal Medicine

## 2022-09-15 VITALS — BP 136/64 | HR 64 | Temp 97.5°F | Resp 16 | Ht 69.0 in | Wt 286.0 lb

## 2022-09-15 DIAGNOSIS — D500 Iron deficiency anemia secondary to blood loss (chronic): Secondary | ICD-10-CM | POA: Diagnosis not present

## 2022-09-15 DIAGNOSIS — N920 Excessive and frequent menstruation with regular cycle: Secondary | ICD-10-CM | POA: Insufficient documentation

## 2022-09-15 DIAGNOSIS — D539 Nutritional anemia, unspecified: Secondary | ICD-10-CM | POA: Diagnosis not present

## 2022-09-15 DIAGNOSIS — Z79899 Other long term (current) drug therapy: Secondary | ICD-10-CM | POA: Insufficient documentation

## 2022-09-15 DIAGNOSIS — E611 Iron deficiency: Secondary | ICD-10-CM

## 2022-09-15 LAB — IRON AND IRON BINDING CAPACITY (CC-WL,HP ONLY)
Iron: 126 ug/dL (ref 28–170)
Saturation Ratios: 36 % — ABNORMAL HIGH (ref 10.4–31.8)
TIBC: 353 ug/dL (ref 250–450)
UIBC: 227 ug/dL (ref 148–442)

## 2022-09-15 LAB — CBC WITH DIFFERENTIAL (CANCER CENTER ONLY)
Abs Immature Granulocytes: 0.01 10*3/uL (ref 0.00–0.07)
Basophils Absolute: 0.1 10*3/uL (ref 0.0–0.1)
Basophils Relative: 2 %
Eosinophils Absolute: 0.4 10*3/uL (ref 0.0–0.5)
Eosinophils Relative: 9 %
HCT: 44.8 % (ref 36.0–46.0)
Hemoglobin: 14.6 g/dL (ref 12.0–15.0)
Immature Granulocytes: 0 %
Lymphocytes Relative: 33 %
Lymphs Abs: 1.6 10*3/uL (ref 0.7–4.0)
MCH: 28.5 pg (ref 26.0–34.0)
MCHC: 32.6 g/dL (ref 30.0–36.0)
MCV: 87.3 fL (ref 80.0–100.0)
Monocytes Absolute: 0.3 10*3/uL (ref 0.1–1.0)
Monocytes Relative: 5 %
Neutro Abs: 2.5 10*3/uL (ref 1.7–7.7)
Neutrophils Relative %: 51 %
Platelet Count: 226 10*3/uL (ref 150–400)
RBC: 5.13 MIL/uL — ABNORMAL HIGH (ref 3.87–5.11)
RDW: 15.8 % — ABNORMAL HIGH (ref 11.5–15.5)
WBC Count: 4.9 10*3/uL (ref 4.0–10.5)
nRBC: 0 % (ref 0.0–0.2)

## 2022-09-15 LAB — FERRITIN: Ferritin: 51 ng/mL (ref 11–307)

## 2022-09-15 LAB — VITAMIN B12: Vitamin B-12: 297 pg/mL (ref 180–914)

## 2022-09-15 NOTE — Progress Notes (Signed)
[facility] [phone]   [phone]  OFFICE PROGRESS NOTE  [name], PA-C [address] [place] [address]  DIAGNOSIS: iron deficiency anemia secondary to menorrhagia and borderline low B12   PRIOR THERAPY: Intolerance to oral iron supplements    CURRENT THERAPY:  1) IV Iron PRN with Venofer 300 mg weekly x3, most recent dose on 05/12/22 2) OTC B12 supplements    INTERVAL HISTORY: [name] 52 y.o. female returns to the clinic today for follow-up visit accompanied by her mother.  The patient is feeling fine today with no concerning complaints except for mild fatigue.  She denied having any current chest pain, shortness of breath, cough or hemoptysis.  She has no nausea, vomiting, diarrhea or constipation.  She has no headache or visual changes.  She denied having any weight loss or night sweats.  She is here today for evaluation and repeat blood work.  MEDICAL HISTORY: Past Medical History:  Diagnosis Date   Allergy    Anxiety    reactive    Depression    Fibromyalgia    GERD (gastroesophageal reflux disease)    History of kidney stones    History of nephrolithiasis 11/2008   under urology care uric acid rx ?   Hypothyroid    PONV (postoperative nausea and vomiting)    Varicose veins    Ventral hernia     ALLERGIES:  is allergic to bee venom, nalbuphine, and ondansetron.  MEDICATIONS:  Current Outpatient Medications  Medication Sig Dispense Refill   busPIRone (BUSPAR) 10 MG tablet      cetirizine (ZYRTEC) 10 MG tablet Take 10 mg by mouth at bedtime.      cimetidine (TAGAMET) 200 MG tablet Take 200 mg by mouth daily as needed.     DULoxetine (CYMBALTA) 60 MG capsule TAKE ONE CAPSULE BY MOUTH ONCE DAILY. (Patient taking differently: Take 60 mg by mouth 2 (two) times daily.) 30 capsule 0   furosemide (LASIX) 40 MG tablet Take 40 mg by mouth daily.     gabapentin (NEURONTIN) 600 MG tablet Take 600 mg by mouth 3  (three) times daily.     levothyroxine (SYNTHROID) 200 MCG tablet Take 200 mcg by mouth daily.     levothyroxine (SYNTHROID, LEVOTHROID) 137 MCG tablet Take 137 mcg by mouth daily before breakfast.     pantoprazole (PROTONIX) 40 MG tablet Take 1 tablet (40 mg total) by mouth daily. 90 tablet 3   promethazine (PHENERGAN) 25 MG tablet Take 0.5 tablets (12.5 mg total) by mouth every 8 (eight) hours as needed for nausea or vomiting. 8 tablet 0   SUMAtriptan (IMITREX) 100 MG tablet Take 100 mg by mouth every 2 (two) hours as needed for migraine. May repeat in 2 hours if headache persists or recurs.     No current facility-administered medications for this visit.    SURGICAL HISTORY:  Past Surgical History:  Procedure Laterality Date   CARPAL TUNNEL RELEASE     CHOLECYSTECTOMY     FOOT SURGERY     INSERTION OF MESH N/A 09/24/2016   Procedure: INSERTION OF MESH;  Surgeon: Griselda Miner, MD;  Location: [facility] OR;  Service: General;  Laterality: N/A;   LITHOTRIPSY     twice by Dr. Patsi Sears   rt knee surgery  08/1987   teeth extracted     on top for decay   VENTRAL HERNIA REPAIR  09/24/2016   VENTRAL HERNIA REPAIR N/A  09/24/2016   Procedure: VENTRAL HERNIA REPAIR WITH MESH;  Surgeon: Griselda Miner, MD;  Location: [facility] OR;  Service: General;  Laterality: N/A;    REVIEW OF SYSTEMS:  A comprehensive review of systems was negative except for: Constitutional: positive for fatigue   PHYSICAL EXAMINATION: General appearance: alert, cooperative, fatigued, and no distress Head: Normocephalic, without obvious abnormality, atraumatic Neck: no adenopathy, no JVD, supple, symmetrical, trachea midline, and thyroid not enlarged, symmetric, no tenderness/mass/nodules Lymph nodes: Cervical, supraclavicular, and axillary nodes normal. Resp: clear to auscultation bilaterally Back: symmetric, no curvature. ROM normal. No CVA tenderness. Cardio: regular rate and rhythm, S1, S2 normal, no murmur, click, rub or  gallop GI: soft, non-tender; bowel sounds normal; no masses,  no organomegaly Extremities: extremities normal, atraumatic, no cyanosis or edema  ECOG PERFORMANCE STATUS: 1 - Symptomatic but completely ambulatory  Blood pressure 136/64, pulse 64, temperature (!) 97.5 F (36.4 C), temperature source Oral, resp. rate 16, height 5\' 9"  (1.753 m), weight 286 lb (129.7 kg), SpO2 100%.  LABORATORY DATA: Lab Results  Component Value Date   WBC 4.9 09/15/2022   HGB 14.6 09/15/2022   HCT 44.8 09/15/2022   MCV 87.3 09/15/2022   PLT 226 09/15/2022      Chemistry      Component Value Date/Time   NA 139 04/14/2022 1222   K 3.8 04/14/2022 1222   CL 105 04/14/2022 1222   CO2 29 04/14/2022 1222   BUN 18 04/14/2022 1222   CREATININE 0.93 04/14/2022 1222      Component Value Date/Time   CALCIUM 9.0 04/14/2022 1222   ALKPHOS 147 (H) 04/14/2022 1222   AST 10 (L) 04/14/2022 1222   ALT 10 04/14/2022 1222   BILITOT 0.6 04/14/2022 1222       RADIOGRAPHIC STUDIES: No results found.  ASSESSMENT AND PLAN: This is a very pleasant 52 years old white female with history of iron deficiency anemia secondary to menorrhagia in addition to low vitamin B12 level.  The patient was treated in the past with iron infusion with Venofer 300 Mg IV weekly for 3 weeks in addition to over-the-counter B12 supplement and tolerating it fairly well. Repeat blood work today showed significant improvement in her hemoglobin up to 14.6 and hematocrit 44.8%.  Iron studies showed normal serum iron of 126 with iron saturation of 36%. Ferritin level and vitamin B12 are still pending. I recommended for the patient to continue with the oral vitamin B12 supplement for now. I will see her back for follow-up visit in 4 months for evaluation and repeat blood work. She was advised to call immediately if she has any other concerning symptoms in the interval. The patient voices understanding of current disease status and treatment  options and is in agreement with the current care plan.  All questions were answered. The patient knows to call the clinic with any problems, questions or concerns. We can certainly see the patient much sooner if necessary.  The total time spent in the appointment was 20 minutes.  Disclaimer: This note was dictated with voice recognition software. Similar sounding words can inadvertently be transcribed and may not be corrected upon review.

## 2022-10-12 ENCOUNTER — Telehealth: Payer: Self-pay

## 2022-10-12 NOTE — Telephone Encounter (Signed)
Dr. Sherian Rein,   Called pt to come in early for procedure. Pt stated that she was exposed to covid, and wanted to reschedule procedure as a precaution. Pt was scheduled to 11/24/22 at 0900. New instructions sent to pt via mychart.

## 2022-10-13 ENCOUNTER — Encounter: Payer: Medicaid Other | Admitting: Gastroenterology

## 2022-11-24 ENCOUNTER — Encounter: Payer: Self-pay | Admitting: Gastroenterology

## 2022-11-24 ENCOUNTER — Ambulatory Visit (AMBULATORY_SURGERY_CENTER): Payer: Medicaid Other | Admitting: Gastroenterology

## 2022-11-24 VITALS — BP 151/77 | HR 57 | Temp 97.9°F | Resp 17 | Ht 69.0 in | Wt 291.0 lb

## 2022-11-24 DIAGNOSIS — Z1211 Encounter for screening for malignant neoplasm of colon: Secondary | ICD-10-CM

## 2022-11-24 DIAGNOSIS — D509 Iron deficiency anemia, unspecified: Secondary | ICD-10-CM

## 2022-11-24 MED ORDER — SODIUM CHLORIDE 0.9 % IV SOLN: 500.0000 mL | INTRAVENOUS | Status: DC

## 2022-11-24 NOTE — Patient Instructions (Signed)
Discharge instructions given. Normal exam. Per Dr. Barron Alvine his office will call for appointment. Resume previous medications. YOU HAD AN ENDOSCOPIC PROCEDURE TODAY AT THE [facility]:   Refer to the procedure report that was given to you for any specific questions about what was found during the examination.  If the procedure report does not answer your questions, please call your gastroenterologist to clarify.  If you requested that your care partner not be given the details of your procedure findings, then the procedure report has been included in a sealed envelope for you to review at your convenience later.  YOU SHOULD EXPECT: Some feelings of bloating in the abdomen. Passage of more gas than usual.  Walking can help get rid of the air that was put into your GI tract during the procedure and reduce the bloating. If you had a lower endoscopy (such as a colonoscopy or flexible sigmoidoscopy) you may notice spotting of blood in your stool or on the toilet paper. If you underwent a bowel prep for your procedure, you may not have a normal bowel movement for a few days.  Please Note:  You might notice some irritation and congestion in your nose or some drainage.  This is from the oxygen used during your procedure.  There is no need for concern and it should clear up in a day or so.  SYMPTOMS TO REPORT IMMEDIATELY:  Following lower endoscopy (colonoscopy or flexible sigmoidoscopy):  Excessive amounts of blood in the stool  Significant tenderness or worsening of abdominal pains  Swelling of the abdomen that is new, acute  Fever of 100F or higher   For urgent or emergent issues, a gastroenterologist can be reached at any hour by calling [phone]. Do not use MyChart messaging for urgent concerns.    DIET:  We do recommend a small meal at first, but then you may proceed to your regular diet.  Drink plenty of fluids but you should avoid alcoholic beverages for 24  hours.  ACTIVITY:  You should plan to take it easy for the rest of today and you should NOT DRIVE or use heavy machinery until tomorrow (because of the sedation medicines used during the test).    FOLLOW UP: Our staff will call the number listed on your records the next business day following your procedure.  We will call around 7:15- 8:00 am to check on you and address any questions or concerns that you may have regarding the information given to you following your procedure. If we do not reach you, we will leave a message.     If any biopsies were taken you will be contacted by phone or by letter within the next 1-3 weeks.  Please call us at [phone] if you have not heard about the biopsies in 3 weeks.    SIGNATURES/CONFIDENTIALITY: You and/or your care partner have signed paperwork which will be entered into your electronic medical record.  These signatures attest to the fact that that the information above on your After Visit Summary has been reviewed and is understood.  Full responsibility of the confidentiality of this discharge information lies with you and/or your care-partner.

## 2022-11-24 NOTE — Progress Notes (Signed)
Sedate, gd SR, tolerated procedure well, VSS, report to RN 

## 2022-11-24 NOTE — Op Note (Signed)
[facility] Patient Name: [name] Procedure Date: 11/24/2022 8:55 AM MRN: [id number] Endoscopist: Doristine Locks , MD, [id number] Age: 52 Referring MD:  Date of Birth: Jul 20, 1970 Gender: Female Account #: [id number] Procedure:                Colonoscopy Indications:              Iron deficiency anemia Medicines:                Monitored Anesthesia Care Procedure:                Pre-Anesthesia Assessment:                           - Prior to the procedure, a History and Physical                            was performed, and patient medications and                            allergies were reviewed. The patient's tolerance of                            previous anesthesia was also reviewed. The risks                            and benefits of the procedure and the sedation                            options and risks were discussed with the patient.                            All questions were answered, and informed consent                            was obtained. Prior Anticoagulants: The patient has                            taken no anticoagulant or antiplatelet agents. ASA                            Grade Assessment: II - A patient with mild systemic                            disease. After reviewing the risks and benefits,                            the patient was deemed in satisfactory condition to                            undergo the procedure.                           After obtaining informed consent, the colonoscope  was passed under direct vision. Throughout the                            procedure, the patient's blood pressure, pulse, and                            oxygen saturations were monitored continuously. The                            CF [id number] #[id number] was introduced through the anus                            and advanced to the the terminal ileum. The                            colonoscopy was performed without  difficulty. The                            patient tolerated the procedure well. The quality                            of the bowel preparation was adequate. The terminal                            ileum, ileocecal valve, appendiceal orifice, and                            rectum were photographed. Scope In: 9:10:59 AM Scope Out: 9:26:06 AM Scope Withdrawal Time: 0 hours 11 minutes 49 seconds  Total Procedure Duration: 0 hours 15 minutes 7 seconds  Findings:                 The perianal and digital rectal examinations were                            normal.                           The entire colon appeared normal.                           A moderate amount of retained and adherent stool                            was found scattered throughout the colon. Lavage of                            the area was performed using copious amounts of                            sterile water, resulting in clearance with adequate                            visualization.  The retroflexed view of the distal rectum and anal                            verge was normal and showed no anal or rectal                            abnormalities.                           The terminal ileum appeared normal. Complications:            No immediate complications. Estimated Blood Loss:     Estimated blood loss: none. Impression:               - The entire examined colon is normal.                           - Retained liquid stool, which was cleared with                            copious lavage.                           - The distal rectum and anal verge are normal on                            retroflexion view.                           - The examined portion of the ileum was normal.                           - No specimens collected. Recommendation:           - Patient has a contact number available for                            emergencies. The signs and symptoms of potential                             delayed complications were discussed with the                            patient. Return to normal activities tomorrow.                            Written discharge instructions were provided to the                            patient.                           - Resume previous diet.                           - Continue present medications.                           -  Repeat colonoscopy in 10 years for screening                            purposes.                           - Return to GI office at appointment to be                            scheduled. Doristine Locks, MD 11/24/2022 9:30:39 AM

## 2022-11-24 NOTE — Progress Notes (Signed)
GASTROENTEROLOGY PROCEDURE H&P NOTE   Primary Care Physician: [name], PA-C    Reason for Procedure:   Iron deficiency anemia, CRC screening  Plan:    Colonoscopy  Patient is appropriate for endoscopic procedure(s) in the ambulatory ([facility]) setting.  The nature of the procedure, as well as the risks, benefits, and alternatives were carefully and thoroughly reviewed with the patient. Ample time for discussion and questions allowed. The patient understood, was satisfied, and agreed to proceed.     HPI: [name] is a 52 y.o. female who presents for Colonoscopy for evaluation of IDA and CRC screening. Attempted colonoscopy in 08/2021, but poor prep and rescheduled for today.   Past Medical History:  Diagnosis Date   Allergy    Anxiety    reactive    Depression    Fibromyalgia    GERD (gastroesophageal reflux disease)    History of kidney stones    History of nephrolithiasis 11/2008   under urology care uric acid rx ?   Hypothyroid    PONV (postoperative nausea and vomiting)    Varicose veins    Ventral hernia     Past Surgical History:  Procedure Laterality Date   CARPAL TUNNEL RELEASE     CHOLECYSTECTOMY     FOOT SURGERY     INSERTION OF MESH N/A 09/24/2016   Procedure: INSERTION OF MESH;  Surgeon: Griselda Miner, MD;  Location: [facility] OR;  Service: General;  Laterality: N/A;   LITHOTRIPSY     twice by Dr. Patsi Sears   rt knee surgery  08/1987   teeth extracted     on top for decay   VENTRAL HERNIA REPAIR  09/24/2016   VENTRAL HERNIA REPAIR N/A 09/24/2016   Procedure: VENTRAL HERNIA REPAIR WITH MESH;  Surgeon: Griselda Miner, MD;  Location: [facility] OR;  Service: General;  Laterality: N/A;    Prior to Admission medications   Medication Sig Start Date End Date Taking? Authorizing Provider  busPIRone (BUSPAR) 10 MG tablet  06/27/21  Yes [provider]  cetirizine (ZYRTEC) 10 MG tablet Take 10 mg by mouth at bedtime.    Yes [provider]   DULoxetine (CYMBALTA) 60 MG capsule TAKE ONE CAPSULE BY MOUTH ONCE DAILY. Patient taking differently: Take 60 mg by mouth 2 (two) times daily. 06/18/15  Yes Panosh, Neta Mends, MD  furosemide (LASIX) 40 MG tablet Take 40 mg by mouth daily.   Yes [provider]  gabapentin (NEURONTIN) 600 MG tablet Take 600 mg by mouth 3 (three) times daily.   Yes [provider]  levothyroxine (SYNTHROID) 175 MCG tablet Take 175 mcg by mouth daily. 11/05/22  Yes [provider]  pantoprazole (PROTONIX) 40 MG tablet Take 1 tablet (40 mg total) by mouth daily. 09/07/22  Yes Dorri Ozturk V, DO  cimetidine (TAGAMET) 200 MG tablet Take 200 mg by mouth daily as needed.    [provider]  promethazine (PHENERGAN) 25 MG tablet Take 0.5 tablets (12.5 mg total) by mouth every 8 (eight) hours as needed for nausea or vomiting. 09/07/22   Nadav Swindell, Verlin Dike, DO    Current Outpatient Medications  Medication Sig Dispense Refill   busPIRone (BUSPAR) 10 MG tablet      cetirizine (ZYRTEC) 10 MG tablet Take 10 mg by mouth at bedtime.      DULoxetine (CYMBALTA) 60 MG capsule TAKE ONE CAPSULE BY MOUTH ONCE DAILY. (Patient taking differently: Take 60 mg by mouth 2 (two) times daily.) 30  capsule 0   furosemide (LASIX) 40 MG tablet Take 40 mg by mouth daily.     gabapentin (NEURONTIN) 600 MG tablet Take 600 mg by mouth 3 (three) times daily.     levothyroxine (SYNTHROID) 175 MCG tablet Take 175 mcg by mouth daily.     pantoprazole (PROTONIX) 40 MG tablet Take 1 tablet (40 mg total) by mouth daily. 90 tablet 3   cimetidine (TAGAMET) 200 MG tablet Take 200 mg by mouth daily as needed.     promethazine (PHENERGAN) 25 MG tablet Take 0.5 tablets (12.5 mg total) by mouth every 8 (eight) hours as needed for nausea or vomiting. 8 tablet 0   Current Facility-Administered Medications  Medication Dose Route Frequency Provider Last Rate Last Admin   0.9 %  sodium chloride infusion  500 mL Intravenous  Continuous Corianne Buccellato V, DO        Allergies as of 11/24/2022 - Review Complete 11/24/2022  Allergen Reaction Noted   Bee venom Itching and Swelling 08/11/2022   Nalbuphine Other (See Comments)    Ondansetron Other (See Comments)     Family History  Problem Relation Age of Onset   Breast cancer Mother        deceased Dec 20, 2007   Osteoarthritis Mother    Heart disease Father    Pneumonia Father        Deceased, 57   Healthy Sister    ADD / ADHD Son    Colon cancer Neg Hx    Rectal cancer Neg Hx    Stomach cancer Neg Hx    Esophageal cancer Neg Hx     Social History   Socioeconomic History   Marital status: Divorced    Spouse name: Not on file   Number of children: 1   Years of education: Not on file   Highest education level: Not on file  Occupational History   Occupation: unempolyed  Tobacco Use   Smoking status: Never   Smokeless tobacco: Never  Vaping Use   Vaping status: Never Used  Substance and Sexual Activity   Alcohol use: No    Alcohol/week: 0.0 standard drinks of alcohol   Drug use: No   Sexual activity: Not on file  Other Topics Concern   Not on file  Social History Narrative   Neg etoh tea.    Independent   Condo doing better now   H H   2 son    She stopped working in Dec 19, 2013 and previously working at a daycare and doing Restaurant manager, fast food. She is not on disability or unemployment.   Childbirth x 1   Mom and child in counseling doing well with support  Counseling     Father with  Hx os anger and other dx .   Divorced in June 2014.   Has BF supportive  and lives with son .                    Social Determinants of Health   Financial Resource Strain: Not on file  Food Insecurity: No Food Insecurity (04/14/2022)   Hunger Vital Sign    Worried About Running Out of Food in the Last Year: Never true    Ran Out of Food in the Last Year: Never true  Transportation Needs: No Transportation Needs (04/14/2022)   PRAPARE - Doctor, general practice (Medical): No    Lack of Transportation (Non-Medical): No  Physical Activity: Not on file  Stress: Not  on file  Social Connections: Not on file  Intimate Partner Violence: Not on file    Physical Exam: Vital signs in last 24 hours: @BP  (!) 150/88   Pulse 79   Temp 97.9 F (36.6 C)   Ht 5\' 9"  (1.753 m)   Wt 291 lb (132 kg)   SpO2 93%   BMI 42.97 kg/m  GEN: NAD EYE: Sclerae anicteric ENT: MMM CV: Non-tachycardic Pulm: CTA b/l GI: Soft, NT/ND NEURO:  Alert & Oriented x 3   Doristine Locks, DO [facility]   11/24/2022 8:58 AM

## 2022-11-25 ENCOUNTER — Telehealth: Payer: Self-pay | Admitting: *Deleted

## 2022-11-25 NOTE — Telephone Encounter (Signed)
  Follow up Call-     11/24/2022    8:08 AM 09/07/2022    1:09 PM  Call back number  Post procedure Call Back phone  # [phone]  Permission to leave phone message Yes Yes    Post procedure follow up phone call. No answer at number given.  VM box full.  Unable to leave a message.

## 2023-01-05 ENCOUNTER — Inpatient Hospital Stay: Payer: Medicaid Other | Attending: Internal Medicine

## 2023-01-05 ENCOUNTER — Other Ambulatory Visit: Payer: Self-pay

## 2023-01-05 ENCOUNTER — Inpatient Hospital Stay: Payer: Medicaid Other | Admitting: Internal Medicine

## 2023-01-05 ENCOUNTER — Other Ambulatory Visit: Payer: Self-pay | Admitting: Physician Assistant

## 2023-01-05 VITALS — BP 141/54 | HR 61 | Temp 98.2°F | Resp 17 | Ht 69.0 in | Wt 300.2 lb

## 2023-01-05 DIAGNOSIS — N920 Excessive and frequent menstruation with regular cycle: Secondary | ICD-10-CM | POA: Insufficient documentation

## 2023-01-05 DIAGNOSIS — Z79899 Other long term (current) drug therapy: Secondary | ICD-10-CM | POA: Insufficient documentation

## 2023-01-05 DIAGNOSIS — E538 Deficiency of other specified B group vitamins: Secondary | ICD-10-CM | POA: Insufficient documentation

## 2023-01-05 DIAGNOSIS — D539 Nutritional anemia, unspecified: Secondary | ICD-10-CM | POA: Diagnosis not present

## 2023-01-05 DIAGNOSIS — D500 Iron deficiency anemia secondary to blood loss (chronic): Secondary | ICD-10-CM | POA: Diagnosis present

## 2023-01-05 DIAGNOSIS — Z1231 Encounter for screening mammogram for malignant neoplasm of breast: Secondary | ICD-10-CM

## 2023-01-05 LAB — CBC WITH DIFFERENTIAL (CANCER CENTER ONLY)
Abs Immature Granulocytes: 0.02 10*3/uL (ref 0.00–0.07)
Basophils Absolute: 0.1 10*3/uL (ref 0.0–0.1)
Basophils Relative: 1 %
Eosinophils Absolute: 0.3 10*3/uL (ref 0.0–0.5)
Eosinophils Relative: 6 %
HCT: 42.4 % (ref 36.0–46.0)
Hemoglobin: 14.3 g/dL (ref 12.0–15.0)
Immature Granulocytes: 0 %
Lymphocytes Relative: 34 %
Lymphs Abs: 1.6 10*3/uL (ref 0.7–4.0)
MCH: 31.4 pg (ref 26.0–34.0)
MCHC: 33.7 g/dL (ref 30.0–36.0)
MCV: 93.2 fL (ref 80.0–100.0)
Monocytes Absolute: 0.3 10*3/uL (ref 0.1–1.0)
Monocytes Relative: 7 %
Neutro Abs: 2.5 10*3/uL (ref 1.7–7.7)
Neutrophils Relative %: 52 %
Platelet Count: 205 10*3/uL (ref 150–400)
RBC: 4.55 MIL/uL (ref 3.87–5.11)
RDW: 13.2 % (ref 11.5–15.5)
WBC Count: 4.8 10*3/uL (ref 4.0–10.5)
nRBC: 0 % (ref 0.0–0.2)

## 2023-01-05 LAB — VITAMIN B12: Vitamin B-12: 284 pg/mL (ref 180–914)

## 2023-01-05 LAB — IRON AND IRON BINDING CAPACITY (CC-WL,HP ONLY)
Iron: 77 ug/dL (ref 28–170)
Saturation Ratios: 20 % (ref 10.4–31.8)
TIBC: 395 ug/dL (ref 250–450)
UIBC: 318 ug/dL (ref 148–442)

## 2023-01-05 LAB — FERRITIN: Ferritin: 37 ng/mL (ref 11–307)

## 2023-01-05 NOTE — Progress Notes (Signed)
[facility] [phone]   [phone]  OFFICE PROGRESS NOTE  [name], PA-C [address] 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 female returns to the clinic today for 53-month follow-up visit.Discussed the use of AI scribe software for clinical note transcription with the patient, who gave verbal consent to proceed.  History of Present Illness   The patient, a 52 year old with a history of iron deficiency anemia secondary to menorrhagia and low vitamin B12 levels, presents for follow-up after receiving iron infusion with Venofer in April 2024. The patient has a known intolerance to oral iron supplements.  Since the last visit four months ago, the patient reports feeling generally well but experiences occasional shortness of breath when climbing stairs or walking long distances. She also reports episodes of dizziness and persistent fatigue, which she attributes to poor sleep quality.  The patient denies any presence of blood in stool or urine. She underwent both endoscopy and colonoscopy, with no significant findings. Menstrual bleeding, previously a concern, has ceased. Despite these improvements, the patient continues to experience fatigue and sleep disturbances.        MEDICAL HISTORY: Past Medical History:  Diagnosis Date   Allergy    Anxiety    reactive    Depression    Fibromyalgia    GERD (gastroesophageal reflux disease)    History of kidney stones    History of nephrolithiasis 11/2008   under urology care uric acid rx ?   Hypothyroid    PONV (postoperative nausea and vomiting)    Varicose veins    Ventral hernia     ALLERGIES:  is  allergic to bee venom, nalbuphine, and ondansetron.  MEDICATIONS:  Current Outpatient Medications  Medication Sig Dispense Refill   busPIRone (BUSPAR) 10 MG tablet      cetirizine (ZYRTEC) 10 MG tablet Take 10 mg by mouth at bedtime.      cimetidine (TAGAMET) 200 MG tablet Take 200 mg by mouth daily as needed.     DULoxetine (CYMBALTA) 60 MG capsule TAKE ONE CAPSULE BY MOUTH ONCE DAILY. (Patient taking differently: Take 60 mg by mouth 2 (two) times daily.) 30 capsule 0   furosemide (LASIX) 40 MG tablet Take 40 mg by mouth daily.     gabapentin (NEURONTIN) 600 MG tablet Take 600 mg by mouth 3 (three) times daily.     levothyroxine (SYNTHROID) 175 MCG tablet Take 175 mcg by mouth daily.     pantoprazole (PROTONIX) 40 MG tablet Take 1 tablet (40 mg total) by mouth daily. 90 tablet 3   promethazine (PHENERGAN) 25 MG tablet Take 0.5 tablets (12.5 mg total) by mouth every 8 (eight) hours as needed for nausea or vomiting. 8 tablet 0   No current facility-administered medications for this visit.    SURGICAL HISTORY:  Past Surgical History:  Procedure Laterality Date   CARPAL TUNNEL RELEASE     CHOLECYSTECTOMY     FOOT SURGERY     INSERTION OF MESH N/A 09/24/2016   Procedure: INSERTION OF MESH;  Surgeon: Griselda Miner, MD;  Location: [facility] OR;  Service: General;  Laterality: N/A;   LITHOTRIPSY  twice by Dr. Patsi Sears   rt knee surgery  08/1987   teeth extracted     on top for decay   VENTRAL HERNIA REPAIR  09/24/2016   VENTRAL HERNIA REPAIR N/A 09/24/2016   Procedure: VENTRAL HERNIA REPAIR WITH MESH;  Surgeon: Griselda Miner, MD;  Location: [facility] OR;  Service: General;  Laterality: N/A;    REVIEW OF SYSTEMS:  A comprehensive review of systems was negative except for: Constitutional: positive for fatigue   PHYSICAL EXAMINATION: General appearance: alert, cooperative, fatigued, and no distress Head: Normocephalic, without obvious abnormality, atraumatic Neck: no adenopathy, no JVD, supple,  symmetrical, trachea midline, and thyroid not enlarged, symmetric, no tenderness/mass/nodules Lymph nodes: Cervical, supraclavicular, and axillary nodes normal. Resp: clear to auscultation bilaterally Back: symmetric, no curvature. ROM normal. No CVA tenderness. Cardio: regular rate and rhythm, S1, S2 normal, no murmur, click, rub or gallop GI: soft, non-tender; bowel sounds normal; no masses,  no organomegaly Extremities: extremities normal, atraumatic, no cyanosis or edema  ECOG PERFORMANCE STATUS: 1 - Symptomatic but completely ambulatory  Blood pressure (!) 141/54, pulse 61, temperature 98.2 F (36.8 C), temperature source Temporal, resp. rate 17, height 5\' 9"  (1.753 m), weight (!) 300 lb 3.2 oz (136.2 kg), SpO2 100%.  LABORATORY DATA: Lab Results  Component Value Date   WBC 4.8 01/05/2023   HGB 14.3 01/05/2023   HCT 42.4 01/05/2023   MCV 93.2 01/05/2023   PLT 205 01/05/2023      Chemistry      Component Value Date/Time   NA 139 04/14/2022 1222   K 3.8 04/14/2022 1222   CL 105 04/14/2022 1222   CO2 29 04/14/2022 1222   BUN 18 04/14/2022 1222   CREATININE 0.93 04/14/2022 1222      Component Value Date/Time   CALCIUM 9.0 04/14/2022 1222   ALKPHOS 147 (H) 04/14/2022 1222   AST 10 (L) 04/14/2022 1222   ALT 10 04/14/2022 1222   BILITOT 0.6 04/14/2022 1222       RADIOGRAPHIC STUDIES: No results found.  ASSESSMENT AND PLAN: This is a very pleasant 52 years old white female with history of iron deficiency anemia secondary to menorrhagia in addition to low vitamin B12 level.  The patient was treated in the past with iron infusion with Venofer 300 Mg IV weekly for 3 weeks in addition to over-the-counter B12 supplement and tolerating it fairly well. The patient is feeling fine except for mild fatigue.    Iron Deficiency Anemia Iron deficiency anemia secondary to menorrhagia. Treated with Venofer iron infusions in April 2024. Current CBC shows hemoglobin 14.3 and hematocrit  42.4, indicating resolution. Iron studies within normal limits (serum iron 77, iron saturation 20%). Reports occasional shortness of breath and dizziness, but no gastrointestinal bleeding. Recent endoscopy and colonoscopy unremarkable. - Monitor hemoglobin and hematocrit levels - Follow-up in six months - Advise to call if experiencing significant fatigue or weakness  Vitamin B12 Deficiency Low vitamin B12 levels. Intolerant to oral iron tablets but tolerates oral vitamin B12 supplementation. Continues to experience fatigue and poor sleep. - Continue oral vitamin B12 supplementation  General Health Maintenance No current menorrhagia. Recent endoscopy and colonoscopy unremarkable. - Maintain regular health screenings and report any new symptoms  Follow-up - Schedule follow-up appointment in six months.   The patient was advised to call immediately if she has any concerning symptoms in the interval. The patient voices understanding of current disease status and treatment options and is in agreement with the current care plan.  All questions were answered. The patient knows to call the clinic with any problems, questions or concerns. We can certainly see the patient much sooner if necessary.  The total time spent in the appointment was 20 minutes.  Disclaimer: This note was dictated with voice recognition software. Similar sounding words can inadvertently be transcribed and may not be corrected upon review.

## 2023-02-10 ENCOUNTER — Ambulatory Visit: Payer: Medicaid Other

## 2023-02-24 ENCOUNTER — Ambulatory Visit
Admission: RE | Admit: 2023-02-24 | Discharge: 2023-02-24 | Disposition: A | Discharge status: Home / Self Care | Payer: Medicaid Other | Source: Ambulatory Visit | Attending: Physician Assistant | Admitting: Physician Assistant

## 2023-02-24 DIAGNOSIS — Z1231 Encounter for screening mammogram for malignant neoplasm of breast: Secondary | ICD-10-CM

## 2023-02-28 ENCOUNTER — Encounter (HOSPITAL_BASED_OUTPATIENT_CLINIC_OR_DEPARTMENT_OTHER): Payer: Self-pay | Admitting: Internal Medicine

## 2023-02-28 DIAGNOSIS — G4710 Hypersomnia, unspecified: Secondary | ICD-10-CM

## 2023-02-28 DIAGNOSIS — R5383 Other fatigue: Secondary | ICD-10-CM

## 2023-03-01 ENCOUNTER — Other Ambulatory Visit: Payer: Self-pay | Admitting: Physician Assistant

## 2023-03-01 DIAGNOSIS — R928 Other abnormal and inconclusive findings on diagnostic imaging of breast: Secondary | ICD-10-CM

## 2023-03-15 ENCOUNTER — Ambulatory Visit
Admission: RE | Admit: 2023-03-15 | Discharge: 2023-03-15 | Disposition: A | Discharge status: Home / Self Care | Payer: Medicaid Other | Source: Ambulatory Visit | Attending: Physician Assistant | Admitting: Physician Assistant

## 2023-03-15 DIAGNOSIS — R928 Other abnormal and inconclusive findings on diagnostic imaging of breast: Secondary | ICD-10-CM

## 2023-03-18 ENCOUNTER — Other Ambulatory Visit: Payer: Self-pay | Admitting: Physician Assistant

## 2023-03-18 DIAGNOSIS — R921 Mammographic calcification found on diagnostic imaging of breast: Secondary | ICD-10-CM

## 2023-03-22 ENCOUNTER — Ambulatory Visit (HOSPITAL_BASED_OUTPATIENT_CLINIC_OR_DEPARTMENT_OTHER): Payer: Medicaid Other | Attending: Physician Assistant | Admitting: Internal Medicine

## 2023-03-22 DIAGNOSIS — G4710 Hypersomnia, unspecified: Secondary | ICD-10-CM | POA: Diagnosis present

## 2023-03-22 DIAGNOSIS — R5383 Other fatigue: Secondary | ICD-10-CM | POA: Diagnosis not present

## 2023-03-23 ENCOUNTER — Encounter (HOSPITAL_BASED_OUTPATIENT_CLINIC_OR_DEPARTMENT_OTHER): Payer: Medicaid Other | Admitting: Internal Medicine

## 2023-03-28 ENCOUNTER — Ambulatory Visit
Admission: RE | Admit: 2023-03-28 | Discharge: 2023-03-28 | Disposition: A | Discharge status: Home / Self Care | Payer: Medicaid Other | Source: Ambulatory Visit | Attending: Physician Assistant | Admitting: Physician Assistant

## 2023-03-28 ENCOUNTER — Encounter: Payer: Self-pay | Admitting: Physician Assistant

## 2023-03-28 ENCOUNTER — Ambulatory Visit
Admission: RE | Admit: 2023-03-28 | Discharge: 2023-03-28 | Disposition: A | Discharge status: Home / Self Care | Source: Ambulatory Visit | Attending: Physician Assistant | Admitting: Physician Assistant

## 2023-03-28 DIAGNOSIS — R921 Mammographic calcification found on diagnostic imaging of breast: Secondary | ICD-10-CM

## 2023-03-30 LAB — SURGICAL PATHOLOGY

## 2023-04-02 DIAGNOSIS — G4710 Hypersomnia, unspecified: Secondary | ICD-10-CM | POA: Diagnosis not present

## 2023-04-02 NOTE — Procedures (Signed)
    [facility] [address] [place], [place] [address] Tel: [phone]   Fax: [phone]  Home Sleep Test Interpretation  Patient Name:  [name], [name] Date:  03/22/2023 Referring Physician:  Pat Kocher. PA-C  Indications for Polysomnography The patient is a 53 year-old Female who is 5\' 8"  and weighs 300.0 lbs. Her BMI equals 46.0.  A home sleep apnea test was performed to evaluate for -OSA.  Polysomnogram Data A home sleep test recorded the standard physiologic parameters including EKG, nasal and oral airflow.  Respiratory parameters of chest and abdominal movements were recorded with Respiratory Inductance Plethysmography belts.  Oxygen saturation was recorded by pulse oximetry.   Study Architecture The total recording time of the polysomnogram was 382.5 minutes.  The total monitoring time was 383.0 minutes.  Time spent in Supine position was 59.0 minutes.   Respiratory Events The study revealed a presence of - obstructive, - central, and 1 mixed apneas resulting in an Apnea index of 0.2 events per hour.  There were 51 hypopneas (>=3% desaturation and/or arousal) resulting in an Apnea\Hypopnea Index (AHI >=3% desaturation and/or arousal) of 8.1 events per hour.  There were 8 hypopneas (>=4% desaturation) resulting in an Apnea\Hypopnea Index (AHI >=4% desaturation) of 1.4 events per hour.  There were - Respiratory Effort Related Arousals resulting in a RERA index of - events per hour. The Respiratory Disturbance Index is 8.1 events per hour.  The snore index was - events per hour.  Mean oxygen saturation was 96.7%.  The lowest oxygen saturation during monitoring time was 93.0%.  Time spent <=88% oxygen saturation was - minutes (-).  Cardiac Summary The average pulse rate was 58.6 bpm.  The minimum pulse rate was 49.0 bpm while the maximum pulse rate was 83.0 bpm.  Cardiac rhythm was normal/abnormal.  Comment: Rare apneas and  hypopneas, within normal limits, AHI (4%) 1.4/hr. Snoring with oxygen desaturation to a nadir of 93%, mean 96.7%.  Diagnosis: Normal study  Recommendations: Manage for symptoms. Encourage health sleep habits and normal body weight.   This study was personally reviewed and electronically signed by: Dr. Waymon Budge Accredited Board Certified in Sleep Medicine Date/Time: 04/02/23 1:45 PM                           Jetty Duhamel Diplomate, American Board of Sleep Medicine  ELECTRONICALLY SIGNED ON:  04/02/2023, 12:41 PM [facility] PH: [phone]   FX: [phone] ACCREDITED BY THE AMERICAN ACADEMY OF SLEEP MEDICINE

## 2023-07-06 ENCOUNTER — Inpatient Hospital Stay: Payer: Medicaid Other | Admitting: Internal Medicine

## 2023-07-06 ENCOUNTER — Inpatient Hospital Stay: Payer: Medicaid Other | Attending: Physician Assistant

## 2023-07-06 VITALS — BP 131/69 | HR 68 | Temp 98.3°F | Resp 18 | Ht 69.0 in | Wt 315.3 lb

## 2023-07-06 DIAGNOSIS — D539 Nutritional anemia, unspecified: Secondary | ICD-10-CM

## 2023-07-06 DIAGNOSIS — E538 Deficiency of other specified B group vitamins: Secondary | ICD-10-CM | POA: Diagnosis not present

## 2023-07-06 DIAGNOSIS — Z79899 Other long term (current) drug therapy: Secondary | ICD-10-CM | POA: Insufficient documentation

## 2023-07-06 DIAGNOSIS — D500 Iron deficiency anemia secondary to blood loss (chronic): Secondary | ICD-10-CM | POA: Insufficient documentation

## 2023-07-06 DIAGNOSIS — N920 Excessive and frequent menstruation with regular cycle: Secondary | ICD-10-CM | POA: Insufficient documentation

## 2023-07-06 LAB — CBC WITH DIFFERENTIAL (CANCER CENTER ONLY)
Abs Immature Granulocytes: 0.03 K/uL (ref 0.00–0.07)
Basophils Absolute: 0.1 K/uL (ref 0.0–0.1)
Basophils Relative: 1 %
Eosinophils Absolute: 0.3 K/uL (ref 0.0–0.5)
Eosinophils Relative: 5 %
HCT: 41.8 % (ref 36.0–46.0)
Hemoglobin: 14 g/dL (ref 12.0–15.0)
Immature Granulocytes: 1 %
Lymphocytes Relative: 33 %
Lymphs Abs: 1.7 K/uL (ref 0.7–4.0)
MCH: 29.1 pg (ref 26.0–34.0)
MCHC: 33.5 g/dL (ref 30.0–36.0)
MCV: 86.9 fL (ref 80.0–100.0)
Monocytes Absolute: 0.3 K/uL (ref 0.1–1.0)
Monocytes Relative: 7 %
Neutro Abs: 2.8 K/uL (ref 1.7–7.7)
Neutrophils Relative %: 53 %
Platelet Count: 225 K/uL (ref 150–400)
RBC: 4.81 MIL/uL (ref 3.87–5.11)
RDW: 13 % (ref 11.5–15.5)
WBC Count: 5.1 K/uL (ref 4.0–10.5)
nRBC: 0 % (ref 0.0–0.2)

## 2023-07-06 LAB — IRON AND IRON BINDING CAPACITY (CC-WL,HP ONLY)
Iron: 103 ug/dL (ref 28–170)
Saturation Ratios: 25 % (ref 10.4–31.8)
TIBC: 419 ug/dL (ref 250–450)
UIBC: 316 ug/dL (ref 148–442)

## 2023-07-06 LAB — FERRITIN: Ferritin: 52 ng/mL (ref 11–307)

## 2023-07-06 LAB — VITAMIN B12: Vitamin B-12: 309 pg/mL (ref 180–914)

## 2023-07-06 NOTE — Progress Notes (Signed)
 [facility] Telephone:([phone]   Fax:([phone]  OFFICE PROGRESS NOTE  Aloha Arnold, PA-C [address] [place] [address]  DIAGNOSIS: iron  deficiency anemia secondary to menorrhagia and borderline low B12   PRIOR THERAPY: Intolerance to oral iron  supplements    CURRENT THERAPY:  1) IV Iron  PRN with Venofer  300 mg weekly x3, most recent dose on 05/12/22 2) OTC B12 supplements    INTERVAL HISTORY: [name] 53 y.o. female returns to the clinic today for 24-month follow-up visit.Discussed the use of AI scribe software for clinical note transcription with the patient, who gave verbal consent to proceed.  History of Present Illness   [name] is a 53 year old female with iron  deficiency anemia secondary to menorrhagia and low vitamin B12 who presents for evaluation and repeat blood work.  She has a history of iron  deficiency anemia secondary to menorrhagia and has been receiving iron  infusions with Venofer  on an as-needed basis. No new complaints or symptoms have arisen since her last visit six months ago. Current CBC results show normal values, and she is awaiting iron  levels to confirm her status.  She has a history of low vitamin B12 levels and is currently taking over-the-counter B12 supplements. She continues to take her B12 supplements regularly and is awaiting current B12 levels to assess her status.         MEDICAL HISTORY: Past Medical History:  Diagnosis Date   Allergy    Anxiety    reactive    Depression    Fibromyalgia    GERD (gastroesophageal reflux disease)    History of kidney stones    History of nephrolithiasis 11/2008   under urology care uric acid rx ?   Hypothyroid    PONV (postoperative nausea and vomiting)    Varicose veins    Ventral hernia     ALLERGIES:  is allergic to bee venom, nalbuphine, and ondansetron .  MEDICATIONS:  Current Outpatient Medications  Medication Sig Dispense Refill    busPIRone (BUSPAR) 10 MG tablet      cetirizine  (ZYRTEC ) 10 MG tablet Take 10 mg by mouth at bedtime.      cimetidine (TAGAMET) 200 MG tablet Take 200 mg by mouth daily as needed.     DULoxetine  (CYMBALTA ) 60 MG capsule TAKE ONE CAPSULE BY MOUTH ONCE DAILY. (Patient taking differently: Take 60 mg by mouth 2 (two) times daily.) 30 capsule 0   furosemide (LASIX) 40 MG tablet Take 40 mg by mouth daily.     gabapentin  (NEURONTIN ) 600 MG tablet Take 600 mg by mouth 3 (three) times daily.     levothyroxine  (SYNTHROID ) 175 MCG tablet Take 175 mcg by mouth daily.     pantoprazole  (PROTONIX ) 40 MG tablet Take 1 tablet (40 mg total) by mouth daily. 90 tablet 3   promethazine  (PHENERGAN ) 25 MG tablet Take 0.5 tablets (12.5 mg total) by mouth every 8 (eight) hours as needed for nausea or vomiting. 8 tablet 0   No current facility-administered medications for this visit.    SURGICAL HISTORY:  Past Surgical History:  Procedure Laterality Date   BREAST BIOPSY Right 03/28/2023   MM RT BREAST BX W LOC DEV 1ST LESION IMAGE BX SPEC STEREO GUIDE 03/28/2023 [facility] MAMMOGRAPHY   CARPAL TUNNEL RELEASE     CHOLECYSTECTOMY     FOOT SURGERY     INSERTION OF MESH N/A 09/24/2016   Procedure: INSERTION OF MESH;  Surgeon: Lillette Reid  III, MD;  Location: [facility] OR;  Service: General;  Laterality: N/A;   LITHOTRIPSY     twice by Dr. Isla Mari   rt knee surgery  08/1987   teeth extracted     on top for decay   VENTRAL HERNIA REPAIR  09/24/2016   VENTRAL HERNIA REPAIR N/A 09/24/2016   Procedure: VENTRAL HERNIA REPAIR WITH MESH;  Surgeon: Caralyn Chandler, MD;  Location: [facility] OR;  Service: General;  Laterality: N/A;    REVIEW OF SYSTEMS:  A comprehensive review of systems was negative except for: Constitutional: positive for fatigue   PHYSICAL EXAMINATION: General appearance: alert, cooperative, fatigued, and no distress Head: Normocephalic, without obvious abnormality, atraumatic Neck: no adenopathy, no JVD, supple,  symmetrical, trachea midline, and thyroid not enlarged, symmetric, no tenderness/mass/nodules Lymph nodes: Cervical, supraclavicular, and axillary nodes normal. Resp: clear to auscultation bilaterally Back: symmetric, no curvature. ROM normal. No CVA tenderness. Cardio: regular rate and rhythm, S1, S2 normal, no murmur, click, rub or gallop GI: soft, non-tender; bowel sounds normal; no masses,  no organomegaly Extremities: extremities normal, atraumatic, no cyanosis or edema  ECOG PERFORMANCE STATUS: 1 - Symptomatic but completely ambulatory  Blood pressure 131/69, pulse 68, temperature 98.3 F (36.8 C), temperature source Tympanic, resp. rate 18, height 5' 9 (1.753 m), weight (!) 315 lb 4.8 oz (143 kg), SpO2 97%.  LABORATORY DATA: Lab Results  Component Value Date   WBC 5.1 07/06/2023   HGB 14.0 07/06/2023   HCT 41.8 07/06/2023   MCV 86.9 07/06/2023   PLT 225 07/06/2023      Chemistry      Component Value Date/Time   NA 139 04/14/2022 1222   K 3.8 04/14/2022 1222   CL 105 04/14/2022 1222   CO2 29 04/14/2022 1222   BUN 18 04/14/2022 1222   CREATININE 0.93 04/14/2022 1222      Component Value Date/Time   CALCIUM 9.0 04/14/2022 1222   ALKPHOS 147 (H) 04/14/2022 1222   AST 10 (L) 04/14/2022 1222   ALT 10 04/14/2022 1222   BILITOT 0.6 04/14/2022 1222       RADIOGRAPHIC STUDIES: No results found.  ASSESSMENT AND PLAN: This is a very pleasant 53 years old white female with history of iron  deficiency anemia secondary to menorrhagia in addition to low vitamin B12 level.  The patient was treated in the past with iron  infusion with Venofer  300 Mg IV weekly for 3 weeks in addition to over-the-counter B12 supplement and tolerating it fairly well. Assessment and Plan    Iron  deficiency anemia Iron  deficiency anemia secondary to menorrhagia, well-controlled post Venofer  infusion. Recent CBC shows normal hemoglobin and hematocrit levels. - Monitor blood work every six months. -  Advise to report any symptoms or issues between visits.  Vitamin B12 deficiency Vitamin B12 deficiency managed with over-the-counter B12 supplements. Awaiting current B12 levels; previous management appears effective as indicated by normal CBC results. - Continue over-the-counter B12 supplements. - Review B12 levels once available.   The patient was advised to call immediately if she has any concerning symptoms in the interval. The patient voices understanding of current disease status and treatment options and is in agreement with the current care plan.  All questions were answered. The patient knows to call the clinic with any problems, questions or concerns. We can certainly see the patient much sooner if necessary.  The total time spent in the appointment was 20 minutes.  Disclaimer: This note was dictated with voice recognition software. Similar sounding words  can inadvertently be transcribed and may not be corrected upon review.

## 2023-08-30 NOTE — Procedures (Signed)
 [name]

## 2023-09-29 ENCOUNTER — Ambulatory Visit: Payer: Self-pay | Admitting: Orthopedic Surgery

## 2023-09-29 NOTE — H&P (Signed)
 [name] is an 53 y.o. female.   Chief Complaint: right knee pain HPI: Visit For: Follow up Location: right; knee Duration: years Severity: pain level 5/10 Timing: chronic Alleviating Factors: PT/OT (strengthening); brace; Mobic Associated Symptoms: catching/locking; popping/clicking; instability Previous Surgery: date: 1987/10/04); I think it was a scope I am not sure, I was told cartilage was removed Prior Imaging: x ray; MRI Previous Injections: did not help; 07/12/2023 right knee - [facility] 06/13/2023 right knee - [facility] Previous PT: none Work Related: no Still locking and giving way. Had cortisone and physical therapy without avail.  Past Medical History:  Diagnosis Date   Allergy    Anxiety    reactive    Depression    Fibromyalgia    GERD (gastroesophageal reflux disease)    History of kidney stones    History of nephrolithiasis 11/2008   under urology care uric acid rx ?   Hypothyroid    PONV (postoperative nausea and vomiting)    Varicose veins    Ventral hernia     Past Surgical History:  Procedure Laterality Date   BREAST BIOPSY Right 03/28/2023   MM RT BREAST BX W LOC DEV 1ST LESION IMAGE BX SPEC STEREO GUIDE 03/28/2023 [facility] MAMMOGRAPHY   CARPAL TUNNEL RELEASE     CHOLECYSTECTOMY     FOOT SURGERY     INSERTION OF MESH N/A 09/24/2016   Procedure: INSERTION OF MESH;  Surgeon: Curvin Deward MOULD, MD;  Location: [facility] OR;  Service: General;  Laterality: N/A;   LITHOTRIPSY     twice by Dr. Chales   rt knee surgery  08/1987   teeth extracted     on top for decay   VENTRAL HERNIA REPAIR  09/24/2016   VENTRAL HERNIA REPAIR N/A 09/24/2016   Procedure: VENTRAL HERNIA REPAIR WITH MESH;  Surgeon: Curvin Deward MOULD, MD;  Location: [facility] OR;  Service: General;  Laterality: N/A;    Family History  Problem Relation Age of Onset   Breast cancer Mother        deceased October 04, 2007   Osteoarthritis Mother    Heart disease Father    Pneumonia Father        Deceased, 68   Healthy  Sister    ADD / ADHD Son    Colon cancer Neg Hx    Rectal cancer Neg Hx    Stomach cancer Neg Hx    Esophageal cancer Neg Hx    Social History:  reports that she has never smoked. She has never used smokeless tobacco. She reports that she does not drink alcohol and does not use drugs.  Allergies:  Allergies  Allergen Reactions   Bee Venom Itching and Swelling    Pt reported allergy 08/11/2022    Nalbuphine Other (See Comments)    (Nubain)--entire body hot/lightheaded dizziness w/blacked out   Ondansetron  Other (See Comments)    (zofran )--pt is unable to recall reaction type    Current meds: busPIRone 10 mg tablet DULoxetine  60 mg capsule,delayed release furosemide 40 mg tablet gabapentin  600 mg tablet levothyroxine  175 mcg tablet meloxicam 15 mg tablet pantoprazole  40 mg tablet,delayed release  Review of Systems  Constitutional: Negative.   HENT: Negative.    Eyes: Negative.   Respiratory: Negative.    Cardiovascular: Negative.   Gastrointestinal: Negative.   Endocrine: Negative.   Genitourinary: Negative.   Musculoskeletal:  Positive for arthralgias, gait problem and joint swelling.  Skin: Negative.   Psychiatric/Behavioral: Negative.      There were no  vitals taken for this visit. Physical Exam Constitutional:      Appearance: She is obese.  HENT:     Head: Normocephalic and atraumatic.     Right Ear: External ear normal.     Left Ear: External ear normal.     Nose: Nose normal.     Mouth/Throat:     Pharynx: Oropharynx is clear.  Eyes:     Conjunctiva/sclera: Conjunctivae normal.  Cardiovascular:     Rate and Rhythm: Normal rate.     Pulses: Normal pulses.  Pulmonary:     Effort: Pulmonary effort is normal.  Abdominal:     General: Bowel sounds are normal.  Musculoskeletal:     Comments: Right knee tender lateral joint line and medial joint line patellofemoral pain compression trace effusion ranges 0-1 40 equivocal McMurray no DVT ipsilateral hip and  ankle exams unremarkable  Skin:    General: Skin is warm.  Neurological:     General: No focal deficit present.     Mental Status: She is alert.   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 Focal marrow edema in the anterior medial aspect the proximal tibia consistent with a small bone bruise or stress reaction small to moderate effusion  X-rays of the right knee demonstrates medial joint space narrowing this mild to moderate. Mild patellofemoral arthrosis.    Assessment/Plan Impression:  1. 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 A bone bruise in the anterior medial tibia 2. Elevated BMI 50.3  Plan:   I discussed the risk and benefits of knee arthroscopy including no changes in their symptoms worsening in their symptoms DVT, PE, anesthetic complications etc. Surgical possibilities include chondroplasty, microfracture, partial meniscectomy, plica excision etc. We also discussed the possible need for repeat arthroscopy in the future as well as possible continued treatment including corticosteroid injections and possible Visco supplementation. In addition we discussed the possibility of even eventually required a total knee replacement if significant arthritis encountered. Also indicating that it is an outpatient procedure. 1-2 days on crutches. Postoperative DVT prophylaxis with aspirin if tolerated. Follow-up in the office in 2 weeks following the surgery. Possible consideration of formal of supervised physical therapy as well.  No history of DVT or MRSA.  Preoperative clearance  Patient was given a prescription for an anti-inflammatory or instructed to take over-the-counter anti-inflammatory  medications. Side effects were discussed including potential elevation of blood pressure, long-term effect on the kidneys and liver, ulcer risks and therefore the need for appropriate monitoring if taken continually. That would include regular blood chemistries by a primary care physician to evaluate kidney function as well as liver function and monitor for potential gastrointestinal effects. My preference is to utilize anti-inflammatories periodically and preemptively to reduce inflammation on a short-term basis. This would decrease the risks associated with long-term use of those medications. In addition anti-inflammatory medications should be taken with a meal. They should not be taken if a blood thinner is being used or the patient has a history of peptic ulcer disease. This to be taken for 1 week if tolerated and then on a as needed basis.  She will call in a month to let us  know if she wants to proceed with the arthroscopy if she is  no better if she is completely better and she can begin weightbearing and increase activity as tolerated  Elevated BMI to be done at [facility].  Oxycodone . Aspirin  Plan R knee scope, partial medial meniscectomy, debridement  Darice CHRISTELLA Randy, PA-C for Dr Duwayne 09/29/2023, 8:18 AM

## 2023-09-29 NOTE — H&P (View-Only) (Signed)
 [name] is an 53 y.o. female.   Chief Complaint: right knee pain HPI: Visit For: Follow up Location: right; knee Duration: years Severity: pain level 5/10 Timing: chronic Alleviating Factors: PT/OT (strengthening); brace; Mobic Associated Symptoms: catching/locking; popping/clicking; instability Previous Surgery: date: 1987/10/04); I think it was a scope I am not sure, I was told cartilage was removed Prior Imaging: x ray; MRI Previous Injections: did not help; 07/12/2023 right knee - [facility] 06/13/2023 right knee - [facility] Previous PT: none Work Related: no Still locking and giving way. Had cortisone and physical therapy without avail.  Past Medical History:  Diagnosis Date   Allergy    Anxiety    reactive    Depression    Fibromyalgia    GERD (gastroesophageal reflux disease)    History of kidney stones    History of nephrolithiasis 11/2008   under urology care uric acid rx ?   Hypothyroid    PONV (postoperative nausea and vomiting)    Varicose veins    Ventral hernia     Past Surgical History:  Procedure Laterality Date   BREAST BIOPSY Right 03/28/2023   MM RT BREAST BX W LOC DEV 1ST LESION IMAGE BX SPEC STEREO GUIDE 03/28/2023 [facility]   CARPAL TUNNEL RELEASE     CHOLECYSTECTOMY     FOOT SURGERY     INSERTION OF MESH N/A 09/24/2016   Procedure: INSERTION OF MESH;  Surgeon: Curvin Deward MOULD, MD;  Location: [facility] OR;  Service: General;  Laterality: N/A;   LITHOTRIPSY     twice by Dr. Chales   rt knee surgery  08/1987   teeth extracted     on top for decay   VENTRAL HERNIA REPAIR  09/24/2016   VENTRAL HERNIA REPAIR N/A 09/24/2016   Procedure: VENTRAL HERNIA REPAIR WITH MESH;  Surgeon: Curvin Deward MOULD, MD;  Location: [facility] OR;  Service: General;  Laterality: N/A;    Family History  Problem Relation Age of Onset   Breast cancer Mother        deceased October 04, 2007   Osteoarthritis Mother    Heart disease Father    Pneumonia Father        Deceased, 68   Healthy  Sister    ADD / ADHD Son    Colon cancer Neg Hx    Rectal cancer Neg Hx    Stomach cancer Neg Hx    Esophageal cancer Neg Hx    Social History:  reports that she has never smoked. She has never used smokeless tobacco. She reports that she does not drink alcohol and does not use drugs.  Allergies:  Allergies  Allergen Reactions   Bee Venom Itching and Swelling    Pt reported allergy 08/11/2022    Nalbuphine Other (See Comments)    (Nubain)--entire body hot/lightheaded dizziness w/blacked out   Ondansetron  Other (See Comments)    (zofran )--pt is unable to recall reaction type    Current meds: busPIRone 10 mg tablet DULoxetine  60 mg capsule,delayed release furosemide 40 mg tablet gabapentin  600 mg tablet levothyroxine  175 mcg tablet meloxicam 15 mg tablet pantoprazole  40 mg tablet,delayed release  Review of Systems  Constitutional: Negative.   HENT: Negative.    Eyes: Negative.   Respiratory: Negative.    Cardiovascular: Negative.   Gastrointestinal: Negative.   Endocrine: Negative.   Genitourinary: Negative.   Musculoskeletal:  Positive for arthralgias, gait problem and joint swelling.  Skin: Negative.   Psychiatric/Behavioral: Negative.      There were no  vitals taken for this visit. Physical Exam Constitutional:      Appearance: She is obese.  HENT:     Head: Normocephalic and atraumatic.     Right Ear: External ear normal.     Left Ear: External ear normal.     Nose: Nose normal.     Mouth/Throat:     Pharynx: Oropharynx is clear.  Eyes:     Conjunctiva/sclera: Conjunctivae normal.  Cardiovascular:     Rate and Rhythm: Normal rate.     Pulses: Normal pulses.  Pulmonary:     Effort: Pulmonary effort is normal.  Abdominal:     General: Bowel sounds are normal.  Musculoskeletal:     Comments: Right knee tender lateral joint line and medial joint line patellofemoral pain compression trace effusion ranges 0-1 40 equivocal McMurray no DVT ipsilateral hip and  ankle exams unremarkable  Skin:    General: Skin is warm.  Neurological:     General: No focal deficit present.     Mental Status: She is alert.   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 Focal marrow edema in the anterior medial aspect the proximal tibia consistent with a small bone bruise or stress reaction small to moderate effusion  X-rays of the right knee demonstrates medial joint space narrowing this mild to moderate. Mild patellofemoral arthrosis.    Assessment/Plan Impression:  1. 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 A bone bruise in the anterior medial tibia 2. Elevated BMI 50.3  Plan:   I discussed the risk and benefits of knee arthroscopy including no changes in their symptoms worsening in their symptoms DVT, PE, anesthetic complications etc. Surgical possibilities include chondroplasty, microfracture, partial meniscectomy, plica excision etc. We also discussed the possible need for repeat arthroscopy in the future as well as possible continued treatment including corticosteroid injections and possible Visco supplementation. In addition we discussed the possibility of even eventually required a total knee replacement if significant arthritis encountered. Also indicating that it is an outpatient procedure. 1-2 days on crutches. Postoperative DVT prophylaxis with aspirin if tolerated. Follow-up in the office in 2 weeks following the surgery. Possible consideration of formal of supervised physical therapy as well.  No history of DVT or MRSA.  Preoperative clearance  Patient was given a prescription for an anti-inflammatory or instructed to take over-the-counter anti-inflammatory  medications. Side effects were discussed including potential elevation of blood pressure, long-term effect on the kidneys and liver, ulcer risks and therefore the need for appropriate monitoring if taken continually. That would include regular blood chemistries by a primary care physician to evaluate kidney function as well as liver function and monitor for potential gastrointestinal effects. My preference is to utilize anti-inflammatories periodically and preemptively to reduce inflammation on a short-term basis. This would decrease the risks associated with long-term use of those medications. In addition anti-inflammatory medications should be taken with a meal. They should not be taken if a blood thinner is being used or the patient has a history of peptic ulcer disease. This to be taken for 1 week if tolerated and then on a as needed basis.  She will call in a month to let us  know if she wants to proceed with the arthroscopy if she is  no better if she is completely better and she can begin weightbearing and increase activity as tolerated  Elevated BMI to be done at [facility].  Oxycodone . Aspirin  Plan R knee scope, partial medial meniscectomy, debridement  Darice CHRISTELLA Randy, PA-C for Dr Duwayne 09/29/2023, 8:18 AM

## 2023-10-19 NOTE — Progress Notes (Signed)
 COVID Vaccine received:  [x]  No []  Yes Date of any COVID positive Test in last 90 days:  none  PCP - Rankin Dike PA-C  clearance scanned to [facility] 09-30-23 Cardiologist - none Hematology- Sherrod Sherrod, MD   Chest x-ray - 03-25-2015  2v EKG - 2017  will repeat  10-20-23 Stress Test -  ECHO -  Cardiac Cath -  CT Coronary Calcium score:   Pacemaker / ICD device [x]  No []  Yes   Spinal Cord Stimulator:[x]  No []  Yes       History of Sleep Apnea? [x]  No []  Yes  Test 03-2023 CPAP used?- [x]  No []  Yes    Patient has: [x]  NO Hx DM   []  Pre-DM   []  DM1  []   DM2 Does the patient monitor blood sugar?   [x]  N/A   []  No []  Yes   Blood Thinner / Instructions:  none Aspirin Instructions:  none  Dental hx: [x]  Dentures: Full set  []  N/A      []  Bridge or Partial:                   []  Loose or Damaged teeth:   Activity level: Able to walk up 2 flights of stairs without becoming significantly short of breath or having chest pain?  [x]  No   []    Yes  Patient can perform ADLs without assistance. []  No   [x]   Yes  Anesthesia review: HTN- no meds, anemia, GERD, Anxiety, PONV  Patient denies any S&S of respiratory illness or Covid - no shortness of breath, fever, cough or chest pain at PAT appointment.  Patient verbalized understanding and agreement to the Pre-Surgical Instructions that were given to them at this PAT appointment. Patient was also educated of the need to review these PAT instructions again prior to her surgery.I reviewed the appropriate phone numbers to call if they have any and questions or concerns.

## 2023-10-19 NOTE — Patient Instructions (Signed)
 SURGICAL WAITING ROOM VISITATION Patients having surgery or a procedure may have no more than 2 support people in the waiting area - these visitors may rotate in the visitor waiting room.   If the patient needs to stay at the hospital during part of their recovery, the visitor guidelines for inpatient rooms apply.  PRE-OP VISITATION  Pre-op nurse will coordinate an appropriate time for 1 support person to accompany the patient in pre-op.  This support person may not rotate.  This visitor will be contacted when the time is appropriate for the visitor to come back in the pre-op area.  Please refer to the [facility] website for the visitor guidelines for Inpatients (after your surgery is over and you are in a regular room).  You are not required to quarantine at this time prior to your surgery. However, you must do this: Hand Hygiene often Do NOT share personal items Notify your provider if you are in close contact with someone who has COVID or you develop fever 100.4 or greater, new onset of sneezing, cough, sore throat, shortness of breath or body aches.  If you test positive for Covid or have been in contact with anyone that has tested positive in the last 10 days please notify you surgeon.    Your procedure is scheduled on:  [facility]  OCTOBER 1 , 2025  Report to [facility] Main Entrance: [address] entrance where the [employer] is available.   Report to admitting at: 06:00    AM  Call this number if you have any questions or problems the morning of surgery [phone]  FOLLOW ANY ADDITIONAL PRE OP INSTRUCTIONS YOU RECEIVED FROM YOUR SURGEON'S OFFICE!!!  Do not eat food after Midnight the night prior to your surgery/procedure.  After Midnight you may have the following liquids until   05:30 AM DAY OF SURGERY  Clear Liquid Diet Water Black Coffee (sugar ok, NO MILK/CREAM OR CREAMERS)  Tea (sugar ok, NO MILK/CREAM OR CREAMERS) regular and decaf                              Plain Jell-O  with no fruit (NO RED)                                           Fruit ices (not with fruit pulp, NO RED)                                     Popsicles (NO RED)                                                                  Juice: NO CITRUS JUICES: only apple, WHITE grape, WHITE cranberry Sports drinks like Gatorade or Powerade (NO RED)                  Oral Hygiene is also important to reduce your risk of infection.        Remember - BRUSH YOUR TEETH THE MORNING OF SURGERY WITH YOUR REGULAR TOOTHPASTE  Do NOT smoke after Midnight the night before surgery.  STOP TAKING all Vitamins, Herbs and supplements 1 week before your surgery.   Take ONLY these medicines the morning of surgery with A SIP OF WATER: Pantoprazole , levothyroxine , duloxetine , gabapentin  and Tylenol  if needed for pain.   DO NOT TAKE FUROSEMIDE the morning of your surgery.    You may not have any metal on your body including hair pins, jewelry, and body piercing  Do not wear make-up, lotions, powders, perfumes or deodorant  Do not wear nail polish including gel and S&S, artificial / acrylic nails, or any other type of covering on natural nails including finger and toenails. If you have artificial nails, gel coating, etc., that needs to be removed by a nail salon, Please have this removed prior to surgery. Not doing so may mean that your surgery could be cancelled or delayed if the Surgeon or anesthesia staff feels like they are unable to monitor you safely.   Do not shave 48 hours prior to surgery to avoid nicks in your skin which may contribute to postoperative infections.   Contacts, Hearing Aids, dentures or bridgework may not be worn into surgery. DENTURES WILL BE REMOVED PRIOR TO SURGERY PLEASE DO NOT APPLY Poly grip OR ADHESIVES!!!  Patients discharged on the day of surgery will not be allowed to drive home.  Someone NEEDS to stay with you for the first 24 hours after anesthesia.  Do not bring  your home medications to the hospital. The Pharmacy will dispense medications listed on your medication list to you during your admission in the Hospital.  Please read over the following fact sheets you were given: IF YOU HAVE QUESTIONS ABOUT YOUR PRE-OP INSTRUCTIONS, PLEASE CALL [phone]   [facility] - Preparing for Surgery Before surgery, you can play an important role.  Because skin is not sterile, your skin needs to be as free of germs as possible.  You can reduce the number of germs on your skin by washing with CHG (chlorahexidine gluconate) soap before surgery.  CHG is an antiseptic cleaner which kills germs and bonds with the skin to continue killing germs even after washing. Please DO NOT use if you have an allergy to CHG or antibacterial soaps.  If your skin becomes reddened/irritated stop using the CHG and inform your nurse when you arrive at Short Stay. Do not shave (including legs and underarms) for at least 48 hours prior to the first CHG shower.  You may shave your face/neck.  Please follow these instructions carefully:  1.  Shower with CHG Soap the night before surgery and the  morning of surgery.  2.  If you choose to wash your hair, wash your hair first as usual with your normal  shampoo.  3.  After you shampoo, rinse your hair and body thoroughly to remove the shampoo.                             4.  Use CHG as you would any other liquid soap.  You can apply chg directly to the skin and wash.  Gently with a scrungie or clean washcloth.  5.  Apply the CHG Soap to your body ONLY FROM THE NECK DOWN.   Do not use on face/ open                           Wound or open sores.  Avoid contact with eyes, ears mouth and genitals (private parts).                       Wash face,  Genitals (private parts) with your normal soap.             6.  Wash thoroughly, paying special attention to the area where your  surgery  will be performed.  7.  Thoroughly rinse your body with warm water from  the neck down.  8.  DO NOT shower/wash with your normal soap after using and rinsing off the CHG Soap.            9.  Pat yourself dry with a clean towel.            10.  Wear clean pajamas.            11.  Place clean sheets on your bed the night of your first shower and do not  sleep with pets.  ON THE DAY OF SURGERY : Do not apply any lotions/deodorants the morning of surgery.  Please wear clean clothes to the hospital/surgery center.     FAILURE TO FOLLOW THESE INSTRUCTIONS MAY RESULT IN THE CANCELLATION OF YOUR SURGERY  PATIENT SIGNATURE_________________________________  NURSE SIGNATURE__________________________________  ________________________________________________________________________

## 2023-10-20 ENCOUNTER — Encounter (HOSPITAL_COMMUNITY): Payer: Self-pay | Admitting: *Deleted

## 2023-10-20 ENCOUNTER — Encounter (HOSPITAL_COMMUNITY)
Admission: RE | Admit: 2023-10-20 | Discharge: 2023-10-20 | Disposition: A | Discharge status: Home / Self Care | Source: Ambulatory Visit | Attending: Specialist | Admitting: Specialist

## 2023-10-20 ENCOUNTER — Other Ambulatory Visit: Payer: Self-pay

## 2023-10-20 VITALS — BP 148/76 | HR 62 | Temp 98.2°F | Resp 20 | Ht 69.0 in | Wt 338.0 lb

## 2023-10-20 DIAGNOSIS — M1711 Unilateral primary osteoarthritis, right knee: Secondary | ICD-10-CM | POA: Diagnosis not present

## 2023-10-20 DIAGNOSIS — I10 Essential (primary) hypertension: Secondary | ICD-10-CM | POA: Insufficient documentation

## 2023-10-20 DIAGNOSIS — Z01818 Encounter for other preprocedural examination: Secondary | ICD-10-CM | POA: Diagnosis present

## 2023-10-20 DIAGNOSIS — Z79899 Other long term (current) drug therapy: Secondary | ICD-10-CM

## 2023-10-20 HISTORY — DX: Unspecified osteoarthritis, unspecified site: M19.90

## 2023-10-20 HISTORY — DX: Essential (primary) hypertension: I10

## 2023-10-20 HISTORY — DX: Anemia, unspecified: D64.9

## 2023-10-20 LAB — BASIC METABOLIC PANEL WITH GFR
Anion gap: 10 (ref 5–15)
BUN: 10 mg/dL (ref 6–20)
CO2: 27 mmol/L (ref 22–32)
Calcium: 9.5 mg/dL (ref 8.9–10.3)
Chloride: 102 mmol/L (ref 98–111)
Creatinine, Ser: 1.1 mg/dL — ABNORMAL HIGH (ref 0.44–1.00)
GFR, Estimated: 60 mL/min — ABNORMAL LOW (ref >60)
Glucose, Bld: 151 mg/dL — ABNORMAL HIGH (ref 70–99)
Potassium: 4.4 mmol/L (ref 3.5–5.1)
Sodium: 139 mmol/L (ref 135–145)

## 2023-10-20 LAB — CBC
HCT: 43.2 % (ref 36.0–46.0)
Hemoglobin: 13.5 g/dL (ref 12.0–15.0)
MCH: 27.8 pg (ref 26.0–34.0)
MCHC: 31.3 g/dL (ref 30.0–36.0)
MCV: 88.9 fL (ref 80.0–100.0)
Platelets: 222 K/uL (ref 150–400)
RBC: 4.86 MIL/uL (ref 3.87–5.11)
RDW: 13.4 % (ref 11.5–15.5)
WBC: 5.9 K/uL (ref 4.0–10.5)
nRBC: 0 % (ref 0.0–0.2)

## 2023-10-24 ENCOUNTER — Other Ambulatory Visit: Payer: Self-pay | Admitting: Gastroenterology

## 2023-10-24 DIAGNOSIS — K2900 Acute gastritis without bleeding: Secondary | ICD-10-CM

## 2023-10-24 DIAGNOSIS — D509 Iron deficiency anemia, unspecified: Secondary | ICD-10-CM

## 2023-10-25 ENCOUNTER — Encounter (HOSPITAL_COMMUNITY): Payer: Self-pay | Admitting: Specialist

## 2023-10-26 ENCOUNTER — Encounter (HOSPITAL_COMMUNITY)
Admission: RE | Disposition: A | Discharge status: Home / Self Care | Payer: Self-pay | Source: Ambulatory Visit | Attending: Specialist

## 2023-10-26 ENCOUNTER — Ambulatory Visit (HOSPITAL_COMMUNITY)
Admission: RE | Admit: 2023-10-26 | Discharge: 2023-10-26 | Disposition: A | Discharge status: Home / Self Care | Source: Ambulatory Visit | Attending: Specialist | Admitting: Specialist

## 2023-10-26 ENCOUNTER — Ambulatory Visit (HOSPITAL_COMMUNITY): Payer: Self-pay | Admitting: Certified Registered Nurse Anesthetist

## 2023-10-26 ENCOUNTER — Encounter (HOSPITAL_COMMUNITY): Payer: Self-pay | Admitting: Specialist

## 2023-10-26 ENCOUNTER — Encounter (HOSPITAL_COMMUNITY): Payer: Self-pay | Admitting: Certified Registered Nurse Anesthetist

## 2023-10-26 ENCOUNTER — Other Ambulatory Visit: Payer: Self-pay

## 2023-10-26 DIAGNOSIS — S83281A Other tear of lateral meniscus, current injury, right knee, initial encounter: Secondary | ICD-10-CM | POA: Insufficient documentation

## 2023-10-26 DIAGNOSIS — X58XXXA Exposure to other specified factors, initial encounter: Secondary | ICD-10-CM | POA: Insufficient documentation

## 2023-10-26 DIAGNOSIS — S83241A Other tear of medial meniscus, current injury, right knee, initial encounter: Secondary | ICD-10-CM | POA: Diagnosis not present

## 2023-10-26 DIAGNOSIS — M1711 Unilateral primary osteoarthritis, right knee: Secondary | ICD-10-CM | POA: Diagnosis present

## 2023-10-26 DIAGNOSIS — I10 Essential (primary) hypertension: Secondary | ICD-10-CM | POA: Insufficient documentation

## 2023-10-26 DIAGNOSIS — M797 Fibromyalgia: Secondary | ICD-10-CM | POA: Diagnosis not present

## 2023-10-26 DIAGNOSIS — F418 Other specified anxiety disorders: Secondary | ICD-10-CM

## 2023-10-26 DIAGNOSIS — E039 Hypothyroidism, unspecified: Secondary | ICD-10-CM | POA: Insufficient documentation

## 2023-10-26 DIAGNOSIS — K219 Gastro-esophageal reflux disease without esophagitis: Secondary | ICD-10-CM | POA: Insufficient documentation

## 2023-10-26 SURGERY — ARTHROSCOPY, KNEE, WITH MEDIAL MENISCECTOMY
Anesthesia: General | Site: Knee | Laterality: Right

## 2023-10-26 MED ORDER — EPINEPHRINE PF 1 MG/ML IJ SOLN
INTRAMUSCULAR | Status: AC
  Filled 2023-10-26: qty 1

## 2023-10-26 MED ORDER — PROPOFOL 10 MG/ML IV BOLUS
INTRAVENOUS | Status: AC
  Filled 2023-10-26: qty 20

## 2023-10-26 MED ORDER — PROPOFOL 10 MG/ML IV BOLUS
INTRAVENOUS | Status: DC | PRN
  Administered 2023-10-26: 200 mg via INTRAVENOUS

## 2023-10-26 MED ORDER — ONDANSETRON HCL 4 MG/2ML IJ SOLN: 4.0000 mg | Freq: Once | INTRAMUSCULAR | Status: DC | PRN

## 2023-10-26 MED ORDER — FENTANYL CITRATE (PF) 100 MCG/2ML IJ SOLN
INTRAMUSCULAR | Status: DC | PRN
  Administered 2023-10-26 (×3): 50 ug via INTRAVENOUS

## 2023-10-26 MED ORDER — KETOROLAC TROMETHAMINE 30 MG/ML IJ SOLN
INTRAMUSCULAR | Status: DC | PRN
  Administered 2023-10-26: 30 mg via INTRAVENOUS

## 2023-10-26 MED ORDER — SODIUM CHLORIDE 0.9 % IR SOLN
Status: DC | PRN
  Administered 2023-10-26: 6000 mL

## 2023-10-26 MED ORDER — ACETAMINOPHEN 10 MG/ML IV SOLN: 1000.0000 mg | Freq: Once | INTRAVENOUS | Status: DC | PRN

## 2023-10-26 MED ORDER — DROPERIDOL 2.5 MG/ML IJ SOLN: 0.6250 mg | Freq: Once | INTRAMUSCULAR | Status: DC | PRN

## 2023-10-26 MED ORDER — CHLORHEXIDINE GLUCONATE 0.12 % MT SOLN
15.0000 mL | Freq: Once | OROMUCOSAL | Status: AC
  Administered 2023-10-26: 15 mL via OROMUCOSAL

## 2023-10-26 MED ORDER — OXYCODONE HCL 5 MG/5ML PO SOLN: 5.0000 mg | Freq: Once | ORAL | Status: DC | PRN

## 2023-10-26 MED ORDER — OXYCODONE HCL 5 MG PO TABS: 5.0000 mg | ORAL_TABLET | ORAL | 0 refills | Status: AC | PRN

## 2023-10-26 MED ORDER — BUPIVACAINE-EPINEPHRINE (PF) 0.5% -1:200000 IJ SOLN
INTRAMUSCULAR | Status: AC
  Filled 2023-10-26: qty 30

## 2023-10-26 MED ORDER — LIDOCAINE HCL (PF) 2 % IJ SOLN
INTRAMUSCULAR | Status: AC
  Filled 2023-10-26: qty 5

## 2023-10-26 MED ORDER — LACTATED RINGERS IV SOLN: INTRAVENOUS | Status: DC

## 2023-10-26 MED ORDER — BUPIVACAINE-EPINEPHRINE 0.5% -1:200000 IJ SOLN
INTRAMUSCULAR | Status: DC | PRN
  Administered 2023-10-26: 25 mL

## 2023-10-26 MED ORDER — CEFAZOLIN SODIUM-DEXTROSE 3-4 GM/150ML-% IV SOLN
3.0000 g | INTRAVENOUS | Status: AC
Start: 2023-10-26 — End: 2023-10-26
  Administered 2023-10-26: 3 g via INTRAVENOUS
  Filled 2023-10-26: qty 150

## 2023-10-26 MED ORDER — ROCURONIUM BROMIDE 10 MG/ML (PF) SYRINGE
PREFILLED_SYRINGE | INTRAVENOUS | Status: AC
  Filled 2023-10-26: qty 10

## 2023-10-26 MED ORDER — ASPIRIN 81 MG PO TBEC: 81.0000 mg | DELAYED_RELEASE_TABLET | Freq: Two times a day (BID) | ORAL | 1 refills | Status: AC

## 2023-10-26 MED ORDER — ONDANSETRON HCL 4 MG/2ML IJ SOLN
INTRAMUSCULAR | Status: DC | PRN
Start: 2023-10-26 — End: 2023-10-26
  Administered 2023-10-26: 4 mg via INTRAVENOUS

## 2023-10-26 MED ORDER — DEXAMETHASONE SODIUM PHOSPHATE 10 MG/ML IJ SOLN
INTRAMUSCULAR | Status: AC
  Filled 2023-10-26: qty 1

## 2023-10-26 MED ORDER — MIDAZOLAM HCL 2 MG/2ML IJ SOLN
INTRAMUSCULAR | Status: AC
  Filled 2023-10-26: qty 2

## 2023-10-26 MED ORDER — SCOPOLAMINE 1 MG/3DAYS TD PT72
1.0000 | MEDICATED_PATCH | TRANSDERMAL | Status: DC
  Administered 2023-10-26: 1 mg via TRANSDERMAL
  Filled 2023-10-26: qty 1

## 2023-10-26 MED ORDER — PROPOFOL 500 MG/50ML IV EMUL
INTRAVENOUS | Status: DC | PRN
  Administered 2023-10-26: 100 ug/kg/min via INTRAVENOUS

## 2023-10-26 MED ORDER — MIDAZOLAM HCL 5 MG/5ML IJ SOLN
INTRAMUSCULAR | Status: DC | PRN
  Administered 2023-10-26: 2 mg via INTRAVENOUS

## 2023-10-26 MED ORDER — OXYCODONE HCL 5 MG PO TABS: 5.0000 mg | ORAL_TABLET | Freq: Once | ORAL | Status: DC | PRN

## 2023-10-26 MED ORDER — DEXMEDETOMIDINE HCL IN NACL 80 MCG/20ML IV SOLN
INTRAVENOUS | Status: AC
  Filled 2023-10-26: qty 20

## 2023-10-26 MED ORDER — ORAL CARE MOUTH RINSE: 15.0000 mL | Freq: Once | OROMUCOSAL | Status: AC

## 2023-10-26 MED ORDER — DEXAMETHASONE SODIUM PHOSPHATE 10 MG/ML IJ SOLN
INTRAMUSCULAR | Status: DC | PRN
  Administered 2023-10-26: 10 mg via INTRAVENOUS

## 2023-10-26 MED ORDER — DOCUSATE SODIUM 100 MG PO CAPS: 100.0000 mg | ORAL_CAPSULE | Freq: Two times a day (BID) | ORAL | 2 refills | Status: AC

## 2023-10-26 MED ORDER — SUCCINYLCHOLINE CHLORIDE 200 MG/10ML IV SOSY
PREFILLED_SYRINGE | INTRAVENOUS | Status: AC
  Filled 2023-10-26: qty 10

## 2023-10-26 MED ORDER — FENTANYL CITRATE (PF) 100 MCG/2ML IJ SOLN
INTRAMUSCULAR | Status: AC
  Filled 2023-10-26: qty 2

## 2023-10-26 MED ORDER — SUCCINYLCHOLINE CHLORIDE 200 MG/10ML IV SOSY
PREFILLED_SYRINGE | INTRAVENOUS | Status: DC | PRN
  Administered 2023-10-26: 140 mg via INTRAVENOUS

## 2023-10-26 MED ORDER — ONDANSETRON HCL 4 MG/2ML IJ SOLN
INTRAMUSCULAR | Status: AC
  Filled 2023-10-26: qty 2

## 2023-10-26 MED ORDER — 0.9 % SODIUM CHLORIDE (POUR BTL) OPTIME
TOPICAL | Status: DC | PRN
  Administered 2023-10-26: 1000 mL

## 2023-10-26 MED ORDER — LIDOCAINE HCL (CARDIAC) PF 100 MG/5ML IV SOSY
PREFILLED_SYRINGE | INTRAVENOUS | Status: DC | PRN
  Administered 2023-10-26: 100 mg via INTRAVENOUS

## 2023-10-26 MED ORDER — EPINEPHRINE PF 1 MG/ML IJ SOLN
INTRAMUSCULAR | Status: DC | PRN
  Administered 2023-10-26 (×2): 1 mL

## 2023-10-26 MED ORDER — SUGAMMADEX SODIUM 200 MG/2ML IV SOLN
INTRAVENOUS | Status: AC
  Filled 2023-10-26: qty 2

## 2023-10-26 MED ORDER — FENTANYL CITRATE PF 50 MCG/ML IJ SOSY: 25.0000 ug | PREFILLED_SYRINGE | INTRAMUSCULAR | Status: DC | PRN

## 2023-10-26 MED ORDER — IBUPROFEN 800 MG PO TABS: 800.0000 mg | ORAL_TABLET | Freq: Three times a day (TID) | ORAL | 1 refills | Status: AC | PRN

## 2023-10-26 MED ORDER — KETOROLAC TROMETHAMINE 30 MG/ML IJ SOLN
INTRAMUSCULAR | Status: AC
  Filled 2023-10-26: qty 1

## 2023-10-26 SURGICAL SUPPLY — 25 items
BAG COUNTER SPONGE SURGICOUNT (BAG) ×2 IMPLANT
BLADE SHAVER TORPEDO 4X13 (MISCELLANEOUS) IMPLANT
BNDG ELASTIC 6INX 5YD STR LF (GAUZE/BANDAGES/DRESSINGS) ×2 IMPLANT
BNDG ELASTIC 6X10 VLCR STRL LF (GAUZE/BANDAGES/DRESSINGS) IMPLANT
BOOTIES KNEE HIGH SLOAN (MISCELLANEOUS) ×4 IMPLANT
COVER SURGICAL LIGHT HANDLE (MISCELLANEOUS) ×2 IMPLANT
DISSECTOR 3.5MM X 13CM CVD (MISCELLANEOUS) IMPLANT
DURAPREP 26ML APPLICATOR (WOUND CARE) ×2 IMPLANT
DW OUTFLOW CASSETTE/TUBE SET (MISCELLANEOUS) ×2 IMPLANT
GAUZE PAD ABD 8X10 STRL (GAUZE/BANDAGES/DRESSINGS) ×2 IMPLANT
GAUZE SPONGE 4X4 12PLY STRL (GAUZE/BANDAGES/DRESSINGS) ×2 IMPLANT
GLOVE BIOGEL PI IND STRL 7.0 (GLOVE) ×2 IMPLANT
GLOVE SURG SS PI 8.0 STRL IVOR (GLOVE) ×2 IMPLANT
GOWN STRL REUS W/ TWL XL LVL3 (GOWN DISPOSABLE) ×4 IMPLANT
KIT TURNOVER KIT A (KITS) ×2 IMPLANT
MANIFOLD NEPTUNE II (INSTRUMENTS) ×2 IMPLANT
PACK ARTHROSCOPY WL (CUSTOM PROCEDURE TRAY) ×2 IMPLANT
PADDING CAST COTTON 6X4 STRL (CAST SUPPLIES) ×2 IMPLANT
PENCIL SMOKE EVACUATOR (MISCELLANEOUS) IMPLANT
SUT ETHILON 4 0 PS 2 18 (SUTURE) ×2 IMPLANT
TOWEL OR 17X26 10 PK STRL BLUE (TOWEL DISPOSABLE) ×2 IMPLANT
TUBING ARTHROSCOPY IRRIG 16FT (MISCELLANEOUS) ×2 IMPLANT
WAND APOLLORF SJ50 AR-9845 (SURGICAL WAND) IMPLANT
WIPE CHG 2% PREP (PERSONAL CARE ITEMS) ×2 IMPLANT
WRAP KNEE MAXI GEL POST OP (GAUZE/BANDAGES/DRESSINGS) ×2 IMPLANT

## 2023-10-26 NOTE — Interval H&P Note (Signed)
 History and Physical Interval Note:  10/26/2023 7:59 AM  [name]  has presented today for surgery, with the diagnosis of Medial meniscus tear right knee, degenerative joint disease.  The various methods of treatment have been discussed with the patient and family. After consideration of risks, benefits and other options for treatment, the patient has consented to  Procedure(s) with comments: ARTHROSCOPY, KNEE, WITH MEDIAL MENISCECTOMY (Right) - RIGHT  KNEE ARTHROSCOPY, PARTIAL MEDIAL  MEMISCECTOMY AND DEBRIDEMENT as a surgical intervention.  The patient's history has been reviewed, patient examined, no change in status, stable for surgery.  I have reviewed the patient's chart and labs.  Questions were answered to the patient's satisfaction.     Reyes JAYSON Billing

## 2023-10-26 NOTE — Anesthesia Procedure Notes (Signed)
 Procedure Name: Intubation Date/Time: 10/26/2023 8:36 AM  Performed by: Buster Catheryn SAUNDERS, CRNAPre-anesthesia Checklist: Patient identified, Emergency Drugs available, Suction available and Patient being monitored Patient Re-evaluated:Patient Re-evaluated prior to induction Oxygen Delivery Method: Circle system utilized Preoxygenation: Pre-oxygenation with 100% oxygen Induction Type: IV induction Ventilation: Mask ventilation without difficulty Laryngoscope Size: Miller and 2 Grade View: Grade II Tube type: Oral Tube size: 7.0 mm Number of attempts: 1 Airway Equipment and Method: Stylet and Oral airway Placement Confirmation: ETT inserted through vocal cords under direct vision, positive ETCO2 and breath sounds checked- equal and bilateral Secured at: 24 cm Tube secured with: Tape Dental Injury: Teeth and Oropharynx as per pre-operative assessment

## 2023-10-26 NOTE — Transfer of Care (Signed)
 Immediate Anesthesia Transfer of Care Note  Patient: [name]  Procedure(s) Performed: ARTHROSCOPY, KNEE, WITH MEDIAL MENISCECTOMY (Right: Knee)  Patient Location: PACU  Anesthesia Type:General  Level of Consciousness: awake, alert , and oriented  Airway & Oxygen Therapy: Patient Spontanous Breathing and Patient connected to face mask oxygen  Post-op Assessment: Report given to RN and Post -op Vital signs reviewed and stable  Post vital signs: Reviewed and stable  Last Vitals:  Vitals Value Taken Time  BP 161/85 10/26/23 09:49  Temp    Pulse 69 10/26/23 09:52  Resp 19 10/26/23 09:52  SpO2 91 % 10/26/23 09:52  Vitals shown include unfiled device data.  Last Pain:  Vitals:   10/26/23 0645  TempSrc: Oral  PainSc:          Complications: No notable events documented.

## 2023-10-26 NOTE — Op Note (Unsigned)
 NAME: [name], [name] MEDICAL RECORD NO: [id number] ACCOUNT NO: [id number] DATE OF BIRTH: 08/16/1970 FACILITY: [facility] LOCATION: [facility] PHYSICIAN: Reyes KYM Billing, MD  Operative Report   DATE OF PROCEDURE: 10/26/2023  PREOPERATIVE DIAGNOSES:  Medial meniscus tear. osteoarthritis of the right knee, elevated BMI.  POSTOPERATIVE DIAGNOSES:  Medial meniscus tear. osteoarthritis of the right knee, lateral meniscus tear, elevated BMI.  PROCEDURE PERFORMED: 1. Right knee arthroscopy with partial medial and lateral meniscectomies. 2. Chondroplasty of the patella, femoral sulcus and femoral groove. 3. Chondroplasty of the medial femoral condyle, medial tibial plateau.  ANESTHESIA:  General.  ASSISTANT:  Jaclyn Bissell, PA.  HISTORY:  This is a pleasant 53 year old female with an elevated BMI, medial meniscus tear, chondromalacia of the patella, mechanical symptoms indicated for knee arthroscopy, partial meniscectomy, and debridement.  Risks and benefits discussed including  bleeding, infection, damage to neurovascular structures, no change in symptoms, worsening symptoms, DVT, PE, anesthetic complications, etc.  DESCRIPTION OF PROCEDURE:  The patient in the supine position. After induction of adequate general anesthesia, she had 3 g of Kefzol . The right lower extremity was prepped and draped in the usual sterile fashion.  A lateral parapatellar portal was  fashioned with a #11 blade.  Ingress cannula atraumatically placed.  Irrigant was utilized to insufflate the joint at 35 mmHg.  Under direct visualization, a medial parapatellar portal was fashioned with a #11 blade after localization with an 18-gauge needle sparing the medial meniscus.  Noted was grade III chondromalacia of the medial tibial plateau and the femoral condyle  weightbearing surface in the posterior horn horizontal tear of the medial meniscus.  I introduced a shaver and performed a chondroplasty of the femoral condyle and  tibial plateau.  Resection with a basket and a shaver of the portion of the posterior horn  tear.  Approximately 25% of the posterior third was excised to a stable base further contoured with the shaver.  The remnant was stable to probe palpation.  ACL was unremarkable.  Lateral compartment revealed a small radial tear of the lateral meniscus, which was shaved and debrided to a stable base.  No significant chondral injury of the lateral compartment.  Suprapatellar pouch revealed extensive grade III changes throughout the patella and of the sulcus and notch.  There was a grade IV change in the superior 20% of the patella.  From both portals, I performed an extensive chondroplasty slightly of the  patella and the femoral sulcus in the femoral groove. There was normal patellofemoral tracking.  There were chondral flap tears.  The gutters were unremarkable.  I revisited all compartments. No further pathology amenable to arthroscopic intervention.  Therefore, I removed all instrumentation.  Portals were closed with 4-0 nylon simple sutures. 0.25% Marcaine  with epinephrine  was infiltrated in the joint. The  wound was dressed sterilely.  Awoken without difficulty and transported to the recovery room in satisfactory condition.  The patient tolerated the procedure well. No complications. No blood loss.  Assistant, Jaclyn Bissell, PA was used throughout the case for patient positioning.  The patient was placed in the leg in the figure-of-four position with a tight lateral compartment and with her elevated BMI.  Technical difficulty due to increased BMI of 50.   NIK D: 10/26/2023 9:35:27 am T: 10/26/2023 9:51:00 am  JOB: [id number]/ [id number]

## 2023-10-26 NOTE — Discharge Instructions (Signed)
Keep dressing clean and dry at all times. After 2 days, remove dressing and place bandaids over incisions. Do not kneel or squat. Ice and elevate, toes above the nose, 20-30 min at a time, 4-5 times per day to reduce swelling. Take aspirin 81mg once daily to prevent blood clots. Follow up in 10-14 days for suture removal. 

## 2023-10-26 NOTE — Anesthesia Postprocedure Evaluation (Signed)
 Anesthesia Post Note  Patient: [name]  Procedure(s) Performed: ARTHROSCOPY, KNEE, WITH MEDIAL MENISCECTOMY (Right: Knee)     Patient location during evaluation: PACU Anesthesia Type: Regional and General Level of consciousness: awake Pain management: pain level controlled Vital Signs Assessment: post-procedure vital signs reviewed and stable Respiratory status: spontaneous breathing Cardiovascular status: blood pressure returned to baseline Postop Assessment: no apparent nausea or vomiting Anesthetic complications: no   No notable events documented.  Last Vitals:  Vitals:   10/26/23 1100 10/26/23 1115  BP: (!) 137/106 127/86  Pulse: 63 64  Resp: 20 13  Temp:    SpO2: 95% 95%    Last Pain:  Vitals:   10/26/23 1100  TempSrc:   PainSc: 0-No pain                 Lauraine KATHEE [place]

## 2023-10-26 NOTE — Anesthesia Preprocedure Evaluation (Addendum)
 Anesthesia Evaluation  Patient identified by MRN, date of birth, ID band Patient awake    Reviewed: Allergy & Precautions, NPO status , Patient's Chart, lab work & pertinent test results  History of Anesthesia Complications (+) PONV and history of anesthetic complications  Airway Mallampati: I       Dental  (+) Edentulous Upper, Edentulous Lower, Dental Advisory Given   Pulmonary    breath sounds clear to auscultation       Cardiovascular hypertension,  Rhythm:Regular Rate:Normal     Neuro/Psych  PSYCHIATRIC DISORDERS Anxiety Depression       GI/Hepatic ,GERD  Medicated,,  Endo/Other  Hypothyroidism    Renal/GU      Musculoskeletal  (+) Arthritis , Osteoarthritis,  Fibromyalgia -  Abdominal   Peds  Hematology  (+) Blood dyscrasia (IDA), anemia   Anesthesia Other Findings   Reproductive/Obstetrics                              Anesthesia Physical Anesthesia Plan  ASA: 3  Anesthesia Plan: General   Post-op Pain Management:    Induction: Intravenous  PONV Risk Score and Plan: 3 and Ondansetron , Dexamethasone , TIVA, Midazolam  and Scopolamine patch - Pre-op  Airway Management Planned: Oral ETT  Additional Equipment:   Intra-op Plan:   Post-operative Plan: Extubation in OR  Informed Consent:      Dental advisory given  Plan Discussed with: CRNA and Surgeon  Anesthesia Plan Comments:          Anesthesia Quick Evaluation

## 2023-10-26 NOTE — Brief Op Note (Signed)
 10/26/2023  7:59 AM  PATIENT:  [name]  53 y.o. female  PRE-OPERATIVE DIAGNOSIS:  Medial meniscus tear right knee, degenerative joint disease  POST-OPERATIVE DIAGNOSIS:  * No post-op diagnosis entered *  PROCEDURE:  Procedure(s) with comments: ARTHROSCOPY, KNEE, WITH MEDIAL MENISCECTOMY (Right) - RIGHT  KNEE ARTHROSCOPY, PARTIAL MEDIAL  MEMISCECTOMY AND DEBRIDEMENT  SURGEON:  Surgeons and Role:    * Duwayne Purchase, MD - Primary  PHYSICIAN ASSISTANT:   ASSISTANTS: Bissell   ANESTHESIA:   general  EBL:  min   BLOOD ADMINISTERED:none  DRAINS: none   LOCAL MEDICATIONS USED:  MARCAINE      SPECIMEN:  No Specimen  DISPOSITION OF SPECIMEN:  N/A  COUNTS:  YES  TOURNIQUET:  * No tourniquets in log *  DICTATION: .Other Dictation: Dictation Number [id number]  PLAN OF CARE: Admit for overnight observation  PATIENT DISPOSITION:  PACU - hemodynamically stable.   Delay start of Pharmacological VTE agent (>24hrs) due to surgical blood loss or risk of bleeding: no

## 2023-10-27 ENCOUNTER — Encounter (HOSPITAL_COMMUNITY): Payer: Self-pay | Admitting: Specialist

## 2023-10-27 NOTE — Addendum Note (Signed)
 Addendum  created 10/27/23 1939 by Waddell Lauraine NOVAK, MD   Clinical Note Signed

## 2023-11-14 ENCOUNTER — Encounter (HOSPITAL_COMMUNITY): Payer: Self-pay | Admitting: Specialist

## 2023-11-14 NOTE — Addendum Note (Signed)
 Addendum  created 11/14/23 1112 by Waddell Lauraine NOVAK, MD   Clinical Note Signed, SmartForm saved

## 2024-01-05 ENCOUNTER — Telehealth: Payer: Self-pay

## 2024-01-05 ENCOUNTER — Inpatient Hospital Stay: Admitting: Internal Medicine

## 2024-01-05 ENCOUNTER — Inpatient Hospital Stay: Attending: Internal Medicine

## 2024-01-05 VITALS — BP 136/84 | HR 68 | Temp 97.7°F | Resp 17 | Ht 69.0 in | Wt 347.0 lb

## 2024-01-05 DIAGNOSIS — N920 Excessive and frequent menstruation with regular cycle: Secondary | ICD-10-CM | POA: Diagnosis present

## 2024-01-05 DIAGNOSIS — D539 Nutritional anemia, unspecified: Secondary | ICD-10-CM

## 2024-01-05 DIAGNOSIS — E538 Deficiency of other specified B group vitamins: Secondary | ICD-10-CM | POA: Insufficient documentation

## 2024-01-05 DIAGNOSIS — E611 Iron deficiency: Secondary | ICD-10-CM

## 2024-01-05 DIAGNOSIS — D500 Iron deficiency anemia secondary to blood loss (chronic): Secondary | ICD-10-CM | POA: Insufficient documentation

## 2024-01-05 LAB — CBC WITH DIFFERENTIAL (CANCER CENTER ONLY)
Abs Immature Granulocytes: 0.02 K/uL (ref 0.00–0.07)
Basophils Absolute: 0.1 K/uL (ref 0.0–0.1)
Basophils Relative: 1 %
Eosinophils Absolute: 0.3 K/uL (ref 0.0–0.5)
Eosinophils Relative: 6 %
HCT: 39.7 % (ref 36.0–46.0)
Hemoglobin: 13.4 g/dL (ref 12.0–15.0)
Immature Granulocytes: 0 %
Lymphocytes Relative: 35 %
Lymphs Abs: 1.8 K/uL (ref 0.7–4.0)
MCH: 28.3 pg (ref 26.0–34.0)
MCHC: 33.8 g/dL (ref 30.0–36.0)
MCV: 83.8 fL (ref 80.0–100.0)
Monocytes Absolute: 0.3 K/uL (ref 0.1–1.0)
Monocytes Relative: 5 %
Neutro Abs: 2.8 K/uL (ref 1.7–7.7)
Neutrophils Relative %: 53 %
Platelet Count: 216 K/uL (ref 150–400)
RBC: 4.74 MIL/uL (ref 3.87–5.11)
RDW: 13.5 % (ref 11.5–15.5)
WBC Count: 5.2 K/uL (ref 4.0–10.5)
nRBC: 0 % (ref 0.0–0.2)

## 2024-01-05 LAB — IRON AND IRON BINDING CAPACITY (CC-WL,HP ONLY)
Iron: 43 ug/dL (ref 28–170)
Saturation Ratios: 10 % — ABNORMAL LOW (ref 10.4–31.8)
TIBC: 427 ug/dL (ref 250–450)
UIBC: 384 ug/dL

## 2024-01-05 LAB — VITAMIN B12: Vitamin B-12: 463 pg/mL (ref 180–914)

## 2024-01-05 LAB — FOLATE: Folate: 13 ng/mL (ref >5.9)

## 2024-01-05 LAB — FERRITIN: Ferritin: 33 ng/mL (ref 11–307)

## 2024-01-05 NOTE — Telephone Encounter (Signed)
 Auth Submission: NO AUTH NEEDED Site of care: Site of care: [facility] Payer: [employer] medicaid Medication & CPT/J Code(s) submitted: Venofer  (Iron  Sucrose) J1756 Diagnosis Code:  Route of submission (phone, fax, portal):  Phone # Fax # Auth type: Buy/Bill PB Units/visits requested: 300mg  x 3 doses Reference number:  Approval from: 01/05/24 to 01/25/24

## 2024-01-05 NOTE — Progress Notes (Signed)
 [facility] [phone]   [phone]  OFFICE PROGRESS NOTE  [name], PA-C [address] [place] [address]  DIAGNOSIS: iron  deficiency anemia secondary to menorrhagia and borderline low B12   PRIOR THERAPY: Intolerance to oral iron  supplements    CURRENT THERAPY:  1) IV Iron  PRN with Venofer  300 mg weekly x3, most recent dose on 05/12/22 2) OTC B12 supplements    INTERVAL HISTORY: [name] 53 y.o. female returns to the clinic today for 6 month follow-up visit.Discussed the use of AI scribe software for clinical note transcription with the patient, who gave verbal consent to proceed.  History of Present Illness [name] is a 53 year old female with iron  deficiency anemia secondary to menorrhagia and vitamin B12 deficiency who presents for evaluation of persistent fatigue.  She has iron  deficiency anemia attributed to chronic menorrhagia, previously managed with intravenous Venofer  due to intolerance to oral iron , and is currently on over-the-counter vitamin B12 supplementation for documented deficiency. She presents for follow-up and repeat laboratory evaluation.  She describes persistent fatigue for the past six months, characterized by decreased energy and vitality, without improvement since her last visit. She denies abnormal bleeding, bruising, or changes in her symptoms.  She expresses concern about a perceived decrease in platelet count.  She has underlying thyroid dysfunction managed with medication, with recent thyroid studies within normal limits. She denies sleep disturbances, and a prior sleep study was unremarkable. She is awaiting results of repeat iron  studies and vitamin B12 levels.    MEDICAL HISTORY: Past Medical History:  Diagnosis Date   Allergy    Anemia    Anxiety    reactive    Arthritis    Depression    Fibromyalgia    GERD (gastroesophageal reflux disease)    History of kidney  stones    History of nephrolithiasis 11/2008   under urology care uric acid rx ?   Hypertension    no meds   Hypothyroid    PONV (postoperative nausea and vomiting)    Varicose veins    Ventral hernia     ALLERGIES:  is allergic to bee venom, nalbuphine, and ondansetron .  MEDICATIONS:  Current Outpatient Medications  Medication Sig Dispense Refill   acetaminophen  (TYLENOL ) 500 MG tablet Take 1,000 mg by mouth every 6 (six) hours as needed for moderate pain (pain score 4-6).     aspirin  EC 81 MG tablet Take 1 tablet (81 mg total) by mouth 2 (two) times daily after a meal. Day after surgery 60 tablet 1   busPIRone (BUSPAR) 10 MG tablet Take 10 mg by mouth at bedtime.     cetirizine  (ZYRTEC ) 10 MG tablet Take 10 mg by mouth at bedtime.      docusate sodium  (COLACE) 100 MG capsule Take 1 capsule (100 mg total) by mouth 2 (two) times daily. 60 capsule 2   DULoxetine  (CYMBALTA ) 60 MG capsule TAKE ONE CAPSULE BY MOUTH ONCE DAILY. (Patient taking differently: Take 60 mg by mouth 2 (two) times daily.) 30 capsule 0   furosemide (LASIX) 40 MG tablet Take 40 mg by mouth daily.     gabapentin  (NEURONTIN ) 600 MG tablet Take 600 mg by mouth 3 (three) times daily.     ibuprofen  (ADVIL ) 800 MG tablet Take 1 tablet (800 mg total) by mouth 3 (three) times daily with meals as needed. 90 tablet 1   levothyroxine  (SYNTHROID ) 175 MCG tablet Take 175 mcg by mouth  daily before breakfast.     oxyCODONE  (OXY IR/ROXICODONE ) 5 MG immediate release tablet Take 1 tablet (5 mg total) by mouth every 4 (four) hours as needed for severe pain (pain score 7-10). 40 tablet 0   pantoprazole  (PROTONIX ) 40 MG tablet Take 1 tablet (40 mg total) by mouth daily. Patient needs follow up appointment for future refills. Please call [phone] to schedule an appointment. 90 tablet 0   promethazine  (PHENERGAN ) 25 MG tablet Take 0.5 tablets (12.5 mg total) by mouth every 8 (eight) hours as needed for nausea or vomiting. 8 tablet 0    No current facility-administered medications for this visit.    SURGICAL HISTORY:  Past Surgical History:  Procedure Laterality Date   BREAST BIOPSY Right 03/28/2023   MM RT BREAST BX W LOC DEV 1ST LESION IMAGE BX SPEC STEREO GUIDE 03/28/2023 [facility]   CARPAL TUNNEL RELEASE Right 1996   left done x 2  last 2024   CHOLECYSTECTOMY  2001   Laparoscopic   FOOT SURGERY Right 02/10/2005   ORIF / reconstruction of ankle   Dr. Vickye   INSERTION OF MESH N/A 09/24/2016   Procedure: INSERTION OF MESH;  Surgeon: Curvin Deward MOULD, MD;  Location: [facility] OR;  Service: General;  Laterality: N/A;   KNEE ARTHROSCOPY WITH MEDIAL MENISECTOMY Right 10/26/2023   Procedure: ARTHROSCOPY, KNEE, WITH MEDIAL MENISCECTOMY;  Surgeon: Duwayne Purchase, MD;  Location: [facility];  Service: Orthopedics;  Laterality: Right;  RIGHT  KNEE ARTHROSCOPY, PARTIAL MEDIAL  MEMISCECTOMY AND DEBRIDEMENT   LITHOTRIPSY     twice by Dr. Chales   rt knee surgery  08/1987   arthroscopy w/ debridement   teeth extracted     bottom for decay   VENTRAL HERNIA REPAIR N/A 09/24/2016   Procedure: VENTRAL HERNIA REPAIR WITH MESH;  Surgeon: Curvin Deward MOULD, MD;  Location: [facility] OR;  Service: General;  Laterality: N/A;    REVIEW OF SYSTEMS:  A comprehensive review of systems was negative except for: Constitutional: positive for fatigue   PHYSICAL EXAMINATION: General appearance: alert, cooperative, fatigued, and no distress Head: Normocephalic, without obvious abnormality, atraumatic Neck: no adenopathy, no JVD, supple, symmetrical, trachea midline, and thyroid not enlarged, symmetric, no tenderness/mass/nodules Lymph nodes: Cervical, supraclavicular, and axillary nodes normal. Resp: clear to auscultation bilaterally Back: symmetric, no curvature. ROM normal. No CVA tenderness. Cardio: regular rate and rhythm, S1, S2 normal, no murmur, click, rub or gallop GI: soft, non-tender; bowel sounds normal; no masses,  no  organomegaly Extremities: extremities normal, atraumatic, no cyanosis or edema  ECOG PERFORMANCE STATUS: 1 - Symptomatic but completely ambulatory  Blood pressure 136/84, pulse 68, temperature 97.7 F (36.5 C), temperature source Temporal, resp. rate 17, height 5' 9 (1.753 m), weight (!) 347 lb (157.4 kg), last menstrual period 03/27/2017, SpO2 100%.  LABORATORY DATA: Lab Results  Component Value Date   WBC 5.2 01/05/2024   HGB 13.4 01/05/2024   HCT 39.7 01/05/2024   MCV 83.8 01/05/2024   PLT 216 01/05/2024      Chemistry      Component Value Date/Time   NA 139 10/20/2023 1024   K 4.4 10/20/2023 1024   CL 102 10/20/2023 1024   CO2 27 10/20/2023 1024   BUN 10 10/20/2023 1024   CREATININE 1.10 (H) 10/20/2023 1024   CREATININE 0.93 04/14/2022 1222      Component Value Date/Time   CALCIUM 9.5 10/20/2023 1024   ALKPHOS 147 (H) 04/14/2022 1222   AST 10 (L) 04/14/2022 1222  ALT 10 04/14/2022 1222   BILITOT 0.6 04/14/2022 1222       RADIOGRAPHIC STUDIES: No results found.  ASSESSMENT AND PLAN: This is a very pleasant 53 years old white female with history of iron  deficiency anemia secondary to menorrhagia in addition to low vitamin B12 level.  The patient was treated in the past with iron  infusion with Venofer  300 Mg IV weekly for 3 weeks in addition to over-the-counter B12 supplement and tolerating it fairly well. Assessment and Plan Assessment & Plan Iron  deficiency anemia secondary to menorrhagia Previously managed with intravenous iron  due to intolerance to oral iron  supplements. Current hemoglobin and CBC are within normal limits, indicating resolution of anemia. Persistent fatigue is not attributable to anemia based on laboratory findings. - Advised follow-up in six months unless laboratory results are significantly abnormal or symptoms worsen. - Her iron  saturation is low today.  I will arrange for the patient to receive iron  infusion with Venofer  at the [facility]  infusion center weekly for 3 weeks starting next week.  Vitamin B12 deficiency Vitamin B12 deficiency managed with over-the-counter supplementation. No evidence of anemia or cytopenias on recent CBC. Awaiting repeat vitamin B12 level to further assess status. - Advised follow-up in six months unless laboratory results are significantly abnormal or symptoms worsen. Patient was advised to call immediately if she has any other concerning symptoms in the interval.  The patient voices understanding of current disease status and treatment options and is in agreement with the current care plan.  All questions were answered. The patient knows to call the clinic with any problems, questions or concerns. We can certainly see the patient much sooner if necessary.  The total time spent in the appointment was 20 minutes.  Disclaimer: This note was dictated with voice recognition software. Similar sounding words can inadvertently be transcribed and may not be corrected upon review.

## 2024-01-12 ENCOUNTER — Ambulatory Visit

## 2024-01-12 VITALS — BP 122/80 | HR 67 | Temp 98.2°F | Resp 20 | Ht 69.0 in | Wt 339.8 lb

## 2024-01-12 DIAGNOSIS — E611 Iron deficiency: Secondary | ICD-10-CM

## 2024-01-12 DIAGNOSIS — D509 Iron deficiency anemia, unspecified: Secondary | ICD-10-CM | POA: Diagnosis not present

## 2024-01-12 MED ORDER — IRON SUCROSE 300 MG IVPB - SIMPLE MED
300.0000 mg | Status: DC
  Administered 2024-01-12: 11:00:00 300 mg via INTRAVENOUS
  Filled 2024-01-12: qty 265

## 2024-01-12 NOTE — Progress Notes (Signed)
 Diagnosis: Iron  Deficiency Anemia  Provider:  Mannam, Praveen MD  Procedure: IV Infusion  IV Type: Peripheral, IV Location: R Forearm  Venofer  (Iron  Sucrose), Dose: 300 mg  Infusion Start Time: 1118  Infusion Stop Time: 1255  Post Infusion IV Care: Patient declined observation and Peripheral IV Discontinued  Discharge: Condition: Stable, Destination: Home . AVS Declined  Performed by:  Rocky FORBES Sar, RN

## 2024-01-23 ENCOUNTER — Other Ambulatory Visit: Payer: Self-pay | Admitting: Gastroenterology

## 2024-01-23 DIAGNOSIS — K2900 Acute gastritis without bleeding: Secondary | ICD-10-CM

## 2024-01-23 DIAGNOSIS — D509 Iron deficiency anemia, unspecified: Secondary | ICD-10-CM

## 2024-01-24 ENCOUNTER — Ambulatory Visit (INDEPENDENT_AMBULATORY_CARE_PROVIDER_SITE_OTHER)

## 2024-01-24 VITALS — BP 139/85 | HR 60 | Temp 98.0°F | Resp 16 | Ht 69.0 in | Wt 347.4 lb

## 2024-01-24 DIAGNOSIS — E611 Iron deficiency: Secondary | ICD-10-CM | POA: Diagnosis not present

## 2024-01-24 DIAGNOSIS — D509 Iron deficiency anemia, unspecified: Secondary | ICD-10-CM | POA: Diagnosis not present

## 2024-01-24 MED ORDER — IRON SUCROSE 300 MG IVPB - SIMPLE MED
300.0000 mg | Status: DC
  Administered 2024-01-24: 300 mg via INTRAVENOUS

## 2024-01-24 NOTE — Progress Notes (Signed)
 Diagnosis: Iron  Deficiency Anemia  Provider:  Mannam, Praveen MD  Procedure: IV Infusion  IV Type: Peripheral, IV Location: L Forearm  Venofer  (Iron  Sucrose), Dose: 300 mg  Infusion Start Time: 1227  Infusion Stop Time: 1405  Post Infusion IV Care: 15 minutes Observation period completed and Peripheral IV Discontinued  Discharge: Condition: Good, Destination: Home . AVS Declined  Performed by:  Rocky FORBES Sar, RN

## 2024-01-31 ENCOUNTER — Ambulatory Visit

## 2024-01-31 VITALS — BP 144/80 | HR 59 | Temp 97.8°F | Resp 18 | Ht 69.0 in | Wt 347.2 lb

## 2024-01-31 DIAGNOSIS — E611 Iron deficiency: Secondary | ICD-10-CM | POA: Diagnosis not present

## 2024-01-31 MED ORDER — IRON SUCROSE 300 MG IVPB - SIMPLE MED
300.0000 mg | Status: DC
  Administered 2024-01-31: 300 mg via INTRAVENOUS
  Filled 2024-01-31: qty 265

## 2024-01-31 NOTE — Progress Notes (Signed)
 Diagnosis: Iron  Deficiency Anemia  Provider:  Mannam, Praveen MD  Procedure: IV Infusion  IV Type: Peripheral, IV Location: R Forearm  Venofer  (Iron  Sucrose), Dose: 300 mg  Infusion Start Time: 1007  Infusion Stop Time: 1149  Post Infusion IV Care: Patient declined observation and Peripheral IV Discontinued  Discharge: Condition: Good, Destination: Home . AVS Declined  Performed by:  Rocky FORBES Search, RN

## 2024-07-05 ENCOUNTER — Inpatient Hospital Stay: Admitting: Internal Medicine

## 2024-07-05 ENCOUNTER — Inpatient Hospital Stay
